# Patient Record
Sex: Female | Born: 1975 | Race: White | Hispanic: Yes | Smoking: Never smoker
Health system: Southern US, Community
[De-identification: ages and names within clinical notes are randomized; demographics above are authoritative.]

## PROBLEM LIST (undated history)

## (undated) DIAGNOSIS — K589 Irritable bowel syndrome without diarrhea: Secondary | ICD-10-CM

## (undated) DIAGNOSIS — O24419 Gestational diabetes mellitus in pregnancy, unspecified control: Secondary | ICD-10-CM

## (undated) DIAGNOSIS — R7303 Prediabetes: Secondary | ICD-10-CM

## (undated) DIAGNOSIS — E7800 Pure hypercholesterolemia, unspecified: Secondary | ICD-10-CM

## (undated) HISTORY — DX: Gestational diabetes mellitus in pregnancy, unspecified control: O24.419

## (undated) HISTORY — DX: Irritable bowel syndrome, unspecified: K58.9

---
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
## 2004-05-11 ENCOUNTER — Inpatient Hospital Stay (HOSPITAL_COMMUNITY): Admission: AD | Admit: 2004-05-11 | Discharge: 2004-05-15 | Payer: Self-pay | Admitting: Obstetrics

## 2004-05-13 ENCOUNTER — Encounter (INDEPENDENT_AMBULATORY_CARE_PROVIDER_SITE_OTHER): Payer: Self-pay | Admitting: *Deleted

## 2007-09-10 ENCOUNTER — Ambulatory Visit: Payer: Self-pay | Admitting: Internal Medicine

## 2007-09-12 ENCOUNTER — Ambulatory Visit: Payer: Self-pay | Admitting: *Deleted

## 2007-11-12 ENCOUNTER — Encounter (INDEPENDENT_AMBULATORY_CARE_PROVIDER_SITE_OTHER): Payer: Self-pay | Admitting: Family Medicine

## 2007-11-12 ENCOUNTER — Encounter (INDEPENDENT_AMBULATORY_CARE_PROVIDER_SITE_OTHER): Payer: Self-pay | Admitting: Nurse Practitioner

## 2007-11-12 ENCOUNTER — Ambulatory Visit: Payer: Self-pay | Admitting: Internal Medicine

## 2007-11-12 LAB — CONVERTED CEMR LAB
Basophils Absolute: 0.1 10*3/uL (ref 0.0–0.1)
Eosinophils Relative: 1 % (ref 0–5)
HCT: 39.4 % (ref 36.0–46.0)
Hemoglobin: 13.1 g/dL (ref 12.0–15.0)
Lymphocytes Relative: 34 % (ref 12–46)
Lymphs Abs: 1.9 10*3/uL (ref 0.7–4.0)
Monocytes Absolute: 0.5 10*3/uL (ref 0.1–1.0)
Neutro Abs: 3.1 10*3/uL (ref 1.7–7.7)
RBC: 4.51 M/uL (ref 3.87–5.11)
RDW: 12.7 % (ref 11.5–15.5)
WBC: 5.5 10*3/uL (ref 4.0–10.5)

## 2007-11-27 ENCOUNTER — Ambulatory Visit: Payer: Self-pay | Admitting: Family Medicine

## 2008-06-18 ENCOUNTER — Ambulatory Visit: Payer: Self-pay | Admitting: Family Medicine

## 2008-06-18 LAB — CONVERTED CEMR LAB
HDL: 47 mg/dL (ref >39)
LDL Cholesterol: 149 mg/dL — ABNORMAL HIGH (ref 0–99)
Total CHOL/HDL Ratio: 4.8
Triglycerides: 156 mg/dL — ABNORMAL HIGH (ref <150)

## 2009-08-08 ENCOUNTER — Encounter: Payer: Self-pay | Admitting: Obstetrics & Gynecology

## 2009-08-08 ENCOUNTER — Inpatient Hospital Stay (HOSPITAL_COMMUNITY): Admission: AD | Admit: 2009-08-08 | Discharge: 2009-08-10 | Payer: Self-pay | Admitting: Obstetrics

## 2009-11-21 DIAGNOSIS — R7303 Prediabetes: Secondary | ICD-10-CM

## 2009-11-21 HISTORY — DX: Prediabetes: R73.03

## 2010-01-15 ENCOUNTER — Ambulatory Visit: Payer: Self-pay | Admitting: Internal Medicine

## 2010-01-27 ENCOUNTER — Encounter (INDEPENDENT_AMBULATORY_CARE_PROVIDER_SITE_OTHER): Payer: Self-pay | Admitting: Adult Health

## 2010-01-27 ENCOUNTER — Ambulatory Visit: Payer: Self-pay | Admitting: Internal Medicine

## 2010-01-27 LAB — CONVERTED CEMR LAB
BUN: 12 mg/dL (ref 6–23)
Basophils Relative: 1 % (ref 0–1)
CO2: 23 meq/L (ref 19–32)
Calcium: 9.8 mg/dL (ref 8.4–10.5)
Chloride: 102 meq/L (ref 96–112)
Creatinine, Ser: 0.54 mg/dL (ref 0.40–1.20)
Eosinophils Absolute: 0.1 10*3/uL (ref 0.0–0.7)
Eosinophils Relative: 2 % (ref 0–5)
Glucose, Bld: 97 mg/dL (ref 70–99)
HCT: 41.2 % (ref 36.0–46.0)
Hgb A1c MFr Bld: 5.8 % (ref 4.6–6.1)
Lymphs Abs: 2.2 10*3/uL (ref 0.7–4.0)
MCHC: 33 g/dL (ref 30.0–36.0)
MCV: 87.5 fL (ref 78.0–100.0)
Monocytes Relative: 6 % (ref 3–12)
Pap Smear: NEGATIVE
RBC: 4.71 M/uL (ref 3.87–5.11)
TSH: 1.025 microintl units/mL (ref 0.350–4.500)
WBC: 5.9 10*3/uL (ref 4.0–10.5)

## 2010-07-02 ENCOUNTER — Ambulatory Visit: Payer: Self-pay | Admitting: Internal Medicine

## 2011-02-25 LAB — CBC
HCT: 33.8 % — ABNORMAL LOW (ref 36.0–46.0)
MCHC: 33.5 g/dL (ref 30.0–36.0)
Platelets: 274 10*3/uL (ref 150–400)
RBC: 3.72 MIL/uL — ABNORMAL LOW (ref 3.87–5.11)
RDW: 13.6 % (ref 11.5–15.5)
WBC: 12.9 10*3/uL — ABNORMAL HIGH (ref 4.0–10.5)

## 2011-02-25 LAB — RPR: RPR Ser Ql: NONREACTIVE

## 2011-04-06 ENCOUNTER — Other Ambulatory Visit (HOSPITAL_COMMUNITY)
Admission: RE | Admit: 2011-04-06 | Discharge: 2011-04-06 | Disposition: A | Discharge status: Home / Self Care | Payer: Self-pay | Source: Ambulatory Visit | Attending: Family Medicine | Admitting: Family Medicine

## 2011-04-06 ENCOUNTER — Other Ambulatory Visit: Payer: Self-pay | Admitting: Family Medicine

## 2011-04-06 DIAGNOSIS — Z01419 Encounter for gynecological examination (general) (routine) without abnormal findings: Secondary | ICD-10-CM | POA: Insufficient documentation

## 2012-05-02 ENCOUNTER — Other Ambulatory Visit: Payer: Self-pay | Admitting: Family Medicine

## 2013-01-30 ENCOUNTER — Other Ambulatory Visit (HOSPITAL_COMMUNITY): Payer: Self-pay | Admitting: Family Medicine

## 2013-01-30 DIAGNOSIS — Z1231 Encounter for screening mammogram for malignant neoplasm of breast: Secondary | ICD-10-CM

## 2013-02-08 ENCOUNTER — Ambulatory Visit (HOSPITAL_COMMUNITY)
Admission: RE | Admit: 2013-02-08 | Discharge: 2013-02-08 | Disposition: A | Discharge status: Home / Self Care | Payer: Self-pay | Source: Ambulatory Visit | Attending: Family Medicine | Admitting: Family Medicine

## 2013-02-08 DIAGNOSIS — Z1231 Encounter for screening mammogram for malignant neoplasm of breast: Secondary | ICD-10-CM | POA: Insufficient documentation

## 2015-02-26 ENCOUNTER — Emergency Department (INDEPENDENT_AMBULATORY_CARE_PROVIDER_SITE_OTHER)
Admission: EM | Admit: 2015-02-26 | Discharge: 2015-02-26 | Disposition: A | Discharge status: Home / Self Care | Payer: No Typology Code available for payment source | Source: Home / Self Care | Attending: Family Medicine | Admitting: Family Medicine

## 2015-02-26 ENCOUNTER — Encounter (HOSPITAL_COMMUNITY): Payer: Self-pay | Admitting: Emergency Medicine

## 2015-02-26 DIAGNOSIS — R51 Headache: Secondary | ICD-10-CM

## 2015-02-26 DIAGNOSIS — R519 Headache, unspecified: Secondary | ICD-10-CM

## 2015-02-26 LAB — POCT PREGNANCY, URINE: PREG TEST UR: NEGATIVE

## 2015-02-26 MED ORDER — KETOROLAC TROMETHAMINE 60 MG/2ML IM SOLN
60.0000 mg | Freq: Once | INTRAMUSCULAR | Status: AC
  Administered 2015-02-26: 60 mg via INTRAMUSCULAR

## 2015-02-26 MED ORDER — DICLOFENAC SODIUM 50 MG PO TBEC: 50.0000 mg | DELAYED_RELEASE_TABLET | Freq: Two times a day (BID) | ORAL | Status: DC | PRN

## 2015-02-26 MED ORDER — DEXAMETHASONE SODIUM PHOSPHATE 10 MG/ML IJ SOLN
INTRAMUSCULAR | Status: AC
  Filled 2015-02-26: qty 1

## 2015-02-26 MED ORDER — KETOROLAC TROMETHAMINE 60 MG/2ML IM SOLN
INTRAMUSCULAR | Status: AC
  Filled 2015-02-26: qty 2

## 2015-02-26 MED ORDER — METOCLOPRAMIDE HCL 5 MG/ML IJ SOLN
INTRAMUSCULAR | Status: AC
Start: 2015-02-26 — End: 2015-02-26
  Filled 2015-02-26: qty 2

## 2015-02-26 MED ORDER — DEXAMETHASONE SODIUM PHOSPHATE 10 MG/ML IJ SOLN
10.0000 mg | Freq: Once | INTRAMUSCULAR | Status: AC
  Administered 2015-02-26: 10 mg via INTRAMUSCULAR

## 2015-02-26 MED ORDER — METOCLOPRAMIDE HCL 5 MG/ML IJ SOLN
5.0000 mg | Freq: Once | INTRAMUSCULAR | Status: AC
  Administered 2015-02-26: 5 mg via INTRAMUSCULAR

## 2015-02-26 NOTE — ED Provider Notes (Signed)
[place] Don[place] PieriniJuana April Atkinson is a 39 y.o. female who presents to Urgent Care today for headache and dizziness. Symptoms present for the last 2 days. Patient notes vague dizzy sensation. She denies any syncope or room spinning. She states her symptoms are similar to previous episodes of headaches. She also notes pain in the right lateral neck. She has not tried any medications. No fevers or chills nausea vomiting or diarrhea. No loss of vision or function. No injury.   No past medical history on file. No past surgical history on file. History  Substance Use Topics  . Smoking status: Not on file  . Smokeless tobacco: Not on file  . Alcohol Use: Not on file   ROS as above Medications: No current facility-administered medications for this encounter.   No current outpatient prescriptions on file.   Not on File   Exam:  BP 126/74 mmHg  Pulse 85  Temp(Src) 98.2 F (36.8 C) (Oral)  Resp 18  SpO2 100%  Orthostatic VS for the past 24 hrs:  BP- Lying Pulse- Lying BP- Sitting Pulse- Sitting BP- Standing at 0 minutes Pulse- Standing at 0 minutes  02/26/15 1153 109/55 mmHg 75 107/63 mmHg 70 106/65 mmHg 76      Gen: Well NAD HEENT: EOMI,  MMM PERRLA Lungs: Normal work of breathing. CTABL Heart: RRR no MRG Abd: NABS, Soft. Nondistended, Nontender Exts: Brisk capillary refill, warm and well perfused.  Neck: Nontender to spinal midline. Tender palpation right trapezius. Normal neck range of motion. Reflexes and strength are intact and equal bilateral upper extremities. Neuro: Alert and oriented normal coordination balance gait sensation and strength.  Patient was given 60 mg IM Toradol, 10 mg IM dexamethasone, and 5 mg IM Reglan prior to discharge.  Results for orders placed or performed during the hospital encounter of 02/26/15 (from the past 24 hour(s))  Pregnancy, urine POC     Status: None   Collection Time: 02/26/15 11:49 AM  Result Value Ref Range   Preg Test, Ur NEGATIVE NEGATIVE    No results found.  Assessment and Plan: 39 y.o. female with headache. Headache type is likely migraine. Patient also has cervical myofascial strain. Prescribed Voltaren. Return as needed.  Discussed warning signs or symptoms. Please see discharge instructions. Patient expresses understanding.     Rodolph BongEvan S Americo Vallery, MD 02/26/15 [phone]

## 2015-02-26 NOTE — ED Notes (Signed)
Pt triaged and assessed by provider.   Provider in before nurse. 

## 2015-02-26 NOTE — Discharge Instructions (Signed)
Thank you for coming in today. Olivia Atkinson.Cefalea migraosa (Migraine Headache) Una cefalea migraosa es un dolor intenso y punzante en uno o ambos lados de la cabeza. La migraa puede durar desde 30 minutos hasta varias horas. CAUSAS  No siempre se conoce la causa exacta de la cefalea migraosa. Sin embargo, IT consultantpueden aparecer Circuit Citycuando los nervios del cerebro se irritan y liberan ciertas sustancias qumicas que causan inflamacin. Esto ocasiona dolor. Existen tambin ciertos factores que pueden desencadenar las migraas, como los siguientes:  Alcohol.  Fumar.  Estrs.  La menstruacin  Quesos aejados.  Los alimentos o las bebidas que contienen nitratos, glutamato, aspartamo o tiramina.  Falta de sueo.  Chocolate.  Cafena.  Hambre.  Actividad fsica extenuante.  Fatiga.  Medicamentos que se usan para tratar Aeronautical engineerel dolor en el pecho (nitroglicerina), pldoras anticonceptivas, estrgeno y algunos medicamentos para la hipertensin arterial. SIGNOS Y SNTOMAS  Dolor en uno o ambos lados de la cabeza.  Dolor pulsante o punzante.  Dolor intenso que impide Ameren Corporationrealizar las actividades diarias.  Dolor que se agrava por cualquier actividad fsica.  Nuseas, vmitos o ambos.  Mareos.  Dolor con la exposicin a las luces brillantes, a los ruidos fuertes o la Morrowactividad.  Sensibilidad general a las luces brillantes, a los ruidos fuertes o a los Limited Brandsolores. Antes de sufrir una migraa, puede recibir seales de advertencia (aura). Un aura puede incluir:  Ver las luces intermitentes.  Ver puntos brillantes, halos o lneas en zigzag.  Tener una visin en tnel o visin borrosa.  Sensacin de entumecimiento u hormigueo.  Dificultad para hablar  Debilidad muscular. DIAGNSTICO  La cefalea migraosa se diagnostica en funcin de lo siguiente:  Sntomas.  Examen fsico.  Neomia DearUna TC (tomografa computada) o resonancia magntica de la cabeza. Estas pruebas de diagnstico por imagen no pueden  diagnosticar las migraas, pero pueden ayudar a Sales promotion account executivedescartar otras causas de las cefaleas. TRATAMIENTO Le prescribirn medicamentos para Engineer, materialsaliviar el dolor y las nuseas. Tambin podrn administrarse medicamentos para ayudar a Armed forces training and education officerprevenir las migraas recurrentes.  INSTRUCCIONES PARA EL CUIDADO EN EL HOGAR  Slo tome medicamentos de venta libre o recetados para Primary school teachercalmar el dolor o Environmental health practitionerel malestar, segn las indicaciones de su mdico. No se recomienda usar los opiceos a Air cabin crewlargo plazo.  Cuando tenga la migraa, acustese en un cuarto oscuro y tranquilo  Lleve un registro diario para Financial risk analystaveriguar lo que puede provocar las Soil scientistcefaleas migraosas. Por ejemplo, escriba:  Lo que usted come y bebe.  Cunto tiempo duerme.  Algn cambio en su dieta o en los medicamentos.  Limite el consumo de bebidas alcohlicas.  Si fuma, deje de hacerlo.  Duerma entre 7 y 9horas, o segn las recomendaciones del mdico.  Limite el estrs.  Mantenga las luces tenues si le Goodrich Corporationmolestan las luces brillantes y la Marlettemigraa empeora. SOLICITE ATENCIN MDICA DE INMEDIATO SI:   La migraa se hace cada vez ms intensa.  Tiene fiebre.  Presenta rigidez en el cuello.  Tiene prdida de visin.  Presenta debilidad muscular o prdida del control muscular.  Comienza a perder el equilibrio o tiene problemas para caminar.  Sufre mareos o se desmaya.  Tiene sntomas graves que son diferentes a los primeros sntomas. ASEGRESE DE QUE:   Comprende estas instrucciones.  Controlar su afeccin.  Recibir ayuda de inmediato si no mejora o si empeora. Document Released: 11/07/2005 Document Revised: 08/28/2013 [facility] Patient Information 2015 [place], [place]. This information is not intended to replace advice given to you by your health care provider. Make sure you discuss  any questions you have with your health care provider. ° °

## 2015-03-02 ENCOUNTER — Encounter (HOSPITAL_COMMUNITY): Payer: Self-pay | Admitting: *Deleted

## 2015-03-02 ENCOUNTER — Emergency Department (HOSPITAL_COMMUNITY)
Admission: EM | Admit: 2015-03-02 | Discharge: 2015-03-02 | Disposition: A | Discharge status: Home / Self Care | Payer: No Typology Code available for payment source | Source: Home / Self Care | Attending: Emergency Medicine | Admitting: Emergency Medicine

## 2015-03-02 DIAGNOSIS — R42 Dizziness and giddiness: Secondary | ICD-10-CM

## 2015-03-02 DIAGNOSIS — G44209 Tension-type headache, unspecified, not intractable: Secondary | ICD-10-CM

## 2015-03-02 HISTORY — DX: Pure hypercholesterolemia, unspecified: E78.00

## 2015-03-02 MED ORDER — KETOROLAC TROMETHAMINE 60 MG/2ML IM SOLN
60.0000 mg | Freq: Once | INTRAMUSCULAR | Status: AC
  Administered 2015-03-02: 60 mg via INTRAMUSCULAR

## 2015-03-02 MED ORDER — KETOROLAC TROMETHAMINE 60 MG/2ML IM SOLN
INTRAMUSCULAR | Status: AC
  Filled 2015-03-02: qty 2

## 2015-03-02 MED ORDER — DEXAMETHASONE SODIUM PHOSPHATE 10 MG/ML IJ SOLN
10.0000 mg | Freq: Once | INTRAMUSCULAR | Status: AC
  Administered 2015-03-02: 10 mg via INTRAMUSCULAR

## 2015-03-02 MED ORDER — CYCLOBENZAPRINE HCL 10 MG PO TABS: 10.0000 mg | ORAL_TABLET | Freq: Every day | ORAL | Status: DC

## 2015-03-02 MED ORDER — DEXAMETHASONE SODIUM PHOSPHATE 10 MG/ML IJ SOLN
INTRAMUSCULAR | Status: AC
  Filled 2015-03-02: qty 1

## 2015-03-02 MED ORDER — MECLIZINE HCL 25 MG PO TABS: 25.0000 mg | ORAL_TABLET | Freq: Three times a day (TID) | ORAL | Status: DC

## 2015-03-02 NOTE — Discharge Instructions (Signed)
You have muscle tension in your neck and shoulders that is causing headaches. You also have some vertigo. Take meclizine 3 times a day for the next 5 days, then as needed for dizziness. Take Flexeril at bedtime for the next 5 days, then as needed for muscle aches. Follow-up if no improvement in 1 week.  Usted tiene la tensin muscular en el cuello y los hombros que est causando dolores de Turkmenistancabeza. Tambin tiene algunos vrtigo. Tomar meclizina 3 veces al Allstateda durante los 5 [address] siguientes, a continuacin, segn sea necesario para el Radissonmareo. Tome Flexeril por la noche durante los prximos 5 [address]  [address], a continuacin, segn sea necesario para los dolores musculares. Seguimiento si no mejora en 1 semana.

## 2015-03-02 NOTE — ED Notes (Signed)
Pt has had an occipital headache, neck pain and bilateral shoulder pain the past 6 days.  She was here 4 days ago and was better for about 3 days, but the pain has returned today.  She is taking diclofenac daily.  She denies nausea, vomiting or light sensitivity

## 2015-03-02 NOTE — ED Provider Notes (Addendum)
CSN: [id number]     Arrival date & time 03/02/15  [id number] History   First MD Initiated Contact with Patient 03/02/15 [phone]     Chief Complaint  Patient presents with  . Headache   (Consider location/radiation/quality/duration/timing/severity/associated sxs/prior Treatment) HPI  She is a 39 year old woman here for evaluation of headache and dizziness. She was seen here 4 days ago for similar symptoms. She was treated with some medications here and felt better. She is discharged with diclofenac, but she has been unable to take this. She took it once but experienced chest pain and shortness of breath that she did not take anymore of it. Her symptoms came back today. She reports a posterior headache associated with bilateral neck and shoulder pain. She denies any associated photophobia or nausea. She also states that when she gets up or lies down she will feel like the room is spinning around her chest to hold onto something. She also describes some generalized fatigue. No focal numbness, tingling, weakness. Denies anything makes the headache better or worse.  Past Medical History  Diagnosis Date  . Hypercholesteremia    History reviewed. No pertinent past surgical history. No family history on file. History  Substance Use Topics  . Smoking status: Never Smoker   . Smokeless tobacco: Not on file  . Alcohol Use: No   OB History    No data available     Review of Systems  Constitutional: Positive for fatigue. Negative for fever.  Eyes: Negative for photophobia.  Respiratory: Negative.   Gastrointestinal: Negative for nausea.  Musculoskeletal: Positive for neck pain. Negative for neck stiffness.  Neurological: Positive for dizziness and headaches. Negative for weakness and numbness.    Allergies  Review of patient's allergies indicates no known allergies.  Home Medications   Prior to Admission medications   Medication Sig Start Date End Date Taking? Authorizing Provider   cyclobenzaprine (FLEXERIL) 10 MG tablet Take 1 tablet (10 mg total) by mouth at bedtime. 03/02/15   Charm RingsErin J Kionte Baumgardner, MD  meclizine (ANTIVERT) 25 MG tablet Take 1 tablet (25 mg total) by mouth 3 (three) times daily. 03/02/15   Charm RingsErin J Amazing Cowman, MD   BP 118/65 mmHg  Pulse 80  Temp(Src) 97.9 F (36.6 C) (Oral)  Resp 18  SpO2 100%  LMP 02/21/2015 Physical Exam  Constitutional: She is oriented to person, place, and time. She appears well-developed and well-nourished. No distress.  HENT:  Mouth/Throat: Oropharynx is clear and moist.  Eyes: Conjunctivae and EOM are normal. Pupils are equal, round, and reactive to light.  Fundoscopic exam:      The right eye shows no papilledema.       The left eye shows no papilledema.  Neck: Neck supple.  Cardiovascular: Regular rhythm and normal heart sounds.   No murmur heard. Pulmonary/Chest: Effort normal and breath sounds normal. No respiratory distress. She has no wheezes. She has no rales.  Lymphadenopathy:    She has no cervical adenopathy.  Neurological: She is alert and oriented to person, place, and time. No cranial nerve deficit. She exhibits normal muscle tone. She displays a negative Romberg sign. Coordination and gait normal.  Rapid alternating movements and finger nose finger testing are normal.    ED Course  Procedures (including critical care time) Labs Review Labs Reviewed - No data to display  Imaging Review No results found.   MDM   1. Tension-type headache, not intractable, unspecified chronicity pattern   2. Vertigo    Toradol 60 mg  IM and dexamethasone 10 mg IM given.  No signs of intracranial process. Will treat with meclizine and Flexeril. Stop the diclofenac given what sounds like a gastritis type reaction. Follow-up if no improvement in 1 week.   Charm Rings, MD 03/02/15 9604  Charm Rings, MD 03/02/15 [phone]

## 2016-06-27 ENCOUNTER — Encounter (HOSPITAL_COMMUNITY): Payer: Self-pay | Admitting: Emergency Medicine

## 2016-06-27 ENCOUNTER — Ambulatory Visit (HOSPITAL_COMMUNITY)
Admission: EM | Admit: 2016-06-27 | Discharge: 2016-06-27 | Disposition: A | Discharge status: Home / Self Care | Payer: Self-pay | Attending: Family Medicine | Admitting: Family Medicine

## 2016-06-27 DIAGNOSIS — R52 Pain, unspecified: Secondary | ICD-10-CM | POA: Insufficient documentation

## 2016-06-27 DIAGNOSIS — J029 Acute pharyngitis, unspecified: Secondary | ICD-10-CM | POA: Insufficient documentation

## 2016-06-27 LAB — POCT RAPID STREP A: Streptococcus, Group A Screen (Direct): NEGATIVE

## 2016-06-27 NOTE — ED Triage Notes (Signed)
Sore throat, body aches/joint aches  That started Thursday 8/3.  Denies fever, denies anyone else at home sick

## 2016-06-27 NOTE — ED Provider Notes (Addendum)
[facility]    CSN: [id number] Arrival date & time: 06/27/16  1004  First Provider Contact:  First MD Initiated Contact with Patient 06/27/16 1058    History   Chief Complaint Chief Complaint  Patient presents with  . Sore Throat  . Generalized Body Aches    HPI Olivia Atkinson is a 40 y.o. female.   HPI She reports sore throat that is gradually worsening, 8/10, for the past 4 days. Pain does not radiate, is aching/burning, worse with po, but able to take po. Takes ibuprofen with minimal relief. No cough. No measured fevers, but reports chills, malaise. + some myalgias without arthralgias or rash. Also mild nausea with breakfast this morning without emesis. No diarrhea, abd pain, dysuria. No sick contacts, LMP: current.   Nonsmoker, no medications. [employer] Spanish video interpretor used throughout encounter.   Past Medical History:  Diagnosis Date  . Hypercholesteremia     There are no active problems to display for this patient.   History reviewed. No pertinent surgical history.  OB History    No data available     Home Medications    Prior to Admission medications   Medication Sig Start Date End Date Taking? Authorizing Provider  ibuprofen (ADVIL,MOTRIN) 200 MG tablet Take 200 mg by mouth every 6 (six) hours as needed.   Yes Historical Provider, MD  cyclobenzaprine (FLEXERIL) 10 MG tablet Take 1 tablet (10 mg total) by mouth at bedtime. 03/02/15   Charm Rings, MD  meclizine (ANTIVERT) 25 MG tablet Take 1 tablet (25 mg total) by mouth 3 (three) times daily. 03/02/15   Charm Rings, MD    Family History No family history on file.  Social History Social History  Substance Use Topics  . Smoking status: Never Smoker  . Smokeless tobacco: Never Used  . Alcohol use No     Allergies   Review of patient's allergies indicates no known allergies.   Review of Systems Review of Systems As above  Physical Exam Triage Vital Signs ED Triage  Vitals  Enc Vitals Group     BP 06/27/16 1056 100/65     Pulse Rate 06/27/16 1056 78     Resp 06/27/16 1056 12     Temp 06/27/16 1056 99.4 F (37.4 C)     Temp Source 06/27/16 1056 Oral     SpO2 06/27/16 1056 100 %     Weight --      Height --      Head Circumference --      Peak Flow --      Pain Score 06/27/16 1108 8     Pain Loc --      Pain Edu? --      Excl. in GC? --    No data found.   Updated Vital Signs BP 100/65 (BP Location: Left Arm)   Pulse 78   Temp 99.4 F (37.4 C) (Oral)   Resp 12   LMP 06/27/2016   SpO2 100%   Physical Exam  Constitutional: She is oriented to person, place, and time. She appears well-developed and well-nourished. No distress.  HENT:  Right Ear: External ear normal.  Left Ear: External ear normal.  Nose: Nose normal.  Mouth/Throat: No oropharyngeal exudate.  oropharynx erythematous with slightly enlarged tonsils without exudates.   Eyes: EOM are normal. Pupils are equal, round, and reactive to light. No scleral icterus.  Neck: Neck supple. No JVD present.  Cardiovascular: Normal rate, regular rhythm, normal  heart sounds and intact distal pulses.   No murmur heard. Pulmonary/Chest: Effort normal and breath sounds normal. No respiratory distress.  Abdominal: Soft. Bowel sounds are normal. She exhibits no distension. There is no tenderness.  Musculoskeletal: Normal range of motion. She exhibits no edema or tenderness.  Lymphadenopathy:    She has cervical adenopathy (tender anterior cervical LAN).  Neurological: She is alert and oriented to person, place, and time. She exhibits normal muscle tone.  Skin: Skin is warm and dry. Capillary refill takes less than 2 seconds.  Vitals reviewed.    UC Treatments / Results  Labs (all labs ordered are listed, but only abnormal results are displayed) Labs Reviewed - No data to display  EKG  EKG Interpretation None       Radiology No results found.  Procedures Procedures (including  critical care time)  Medications Ordered in UC Medications - No data to display  Initial Impression / Assessment and Plan / UC Course  I have reviewed the triage vital signs and the nursing notes.  Pertinent labs & imaging results that were available during my care of the patient were reviewed by me and considered in my medical decision making (see chart for details).  Final Clinical Impressions(s) / UC Diagnoses   Final diagnoses:  Acute pharyngitis, unspecified etiology   40 y.o. female with 4 days of sore throat and symptoms consistent with viral syndrome. Rapid strep negative. Pt is afebrile and in no distress. Advised that she treat symptomatically with lozenges, honey, hydration, relative rest, and return if symptoms fail to improve over the next week or she develops inability to swallow or trouble breathing.   New Prescriptions New Prescriptions   No medications on file     Tyrone Nineyan B Doretta Remmert, MD 06/27/16 1138    Tyrone Nineyan B Graysen Depaula, MD 06/27/16 [phone]

## 2016-06-29 LAB — CULTURE, GROUP A STREP (THRC)

## 2016-09-23 LAB — GLUCOSE, POCT (MANUAL RESULT ENTRY): POC GLUCOSE: 71 mg/dL (ref 70–99)

## 2017-06-20 ENCOUNTER — Encounter (HOSPITAL_COMMUNITY): Payer: Self-pay | Admitting: Emergency Medicine

## 2017-06-20 ENCOUNTER — Emergency Department (HOSPITAL_COMMUNITY)
Admission: EM | Admit: 2017-06-20 | Discharge: 2017-06-21 | Disposition: A | Discharge status: Home / Self Care | Payer: Self-pay | Attending: Emergency Medicine | Admitting: Emergency Medicine

## 2017-06-20 DIAGNOSIS — T7840XA Allergy, unspecified, initial encounter: Secondary | ICD-10-CM | POA: Insufficient documentation

## 2017-06-20 MED ORDER — SODIUM CHLORIDE 0.9 % IV BOLUS (SEPSIS)
1000.0000 mL | Freq: Once | INTRAVENOUS | Status: AC
  Administered 2017-06-20: 1000 mL via INTRAVENOUS

## 2017-06-20 MED ORDER — FAMOTIDINE IN NACL 20-0.9 MG/50ML-% IV SOLN
20.0000 mg | Freq: Once | INTRAVENOUS | Status: AC
  Administered 2017-06-20: 20 mg via INTRAVENOUS
  Filled 2017-06-20: qty 50

## 2017-06-20 MED ORDER — PREDNISONE 10 MG PO TABS: 40.0000 mg | ORAL_TABLET | Freq: Every day | ORAL | 0 refills | Status: AC

## 2017-06-20 MED ORDER — METHYLPREDNISOLONE SODIUM SUCC 125 MG IJ SOLR
125.0000 mg | Freq: Once | INTRAMUSCULAR | Status: AC
  Administered 2017-06-20: 125 mg via INTRAVENOUS
  Filled 2017-06-20: qty 2

## 2017-06-20 MED ORDER — EPINEPHRINE 0.3 MG/0.3ML IJ SOAJ: 0.3000 mg | Freq: Once | INTRAMUSCULAR | 0 refills | Status: AC

## 2017-06-20 MED ORDER — DIPHENHYDRAMINE HCL 50 MG/ML IJ SOLN
50.0000 mg | Freq: Once | INTRAMUSCULAR | Status: AC
  Administered 2017-06-20: 50 mg via INTRAVENOUS
  Filled 2017-06-20: qty 1

## 2017-06-20 NOTE — ED Provider Notes (Signed)
[facility]   CSN: [id number] Arrival date & time: 06/20/17  2225     History   Chief Complaint Chief Complaint  Patient presents with  . Allergic Reaction    HPI Olivia Atkinson is a 41 y.o. female.  HPI   Patient is a 41 year old female sent with allergic reaction. Patient reports that earlier today she Bit on the back by a bug at around 9:30 PM and since then she has been breaking out in hives, itchy. Patient denies any shortness of breath, trouble breathing.  Past Medical History:  Diagnosis Date  . Hypercholesteremia     There are no active problems to display for this patient.   History reviewed. No pertinent surgical history.  OB History    No data available       Home Medications    Prior to Admission medications   Medication Sig Start Date End Date Taking? Authorizing Provider  cyclobenzaprine (FLEXERIL) 10 MG tablet Take 1 tablet (10 mg total) by mouth at bedtime. 03/02/15   Charm RingsHonig, Erin J, MD  ibuprofen (ADVIL,MOTRIN) 200 MG tablet Take 200 mg by mouth every 6 (six) hours as needed.    [provider]  meclizine (ANTIVERT) 25 MG tablet Take 1 tablet (25 mg total) by mouth 3 (three) times daily. 03/02/15   Charm RingsHonig, Erin J, MD    Family History History reviewed. No pertinent family history.  Social History Social History  Substance Use Topics  . Smoking status: Never Smoker  . Smokeless tobacco: Never Used  . Alcohol use No     Allergies   Patient has no known allergies.   Review of Systems Review of Systems  Constitutional: Negative for activity change.  Respiratory: Negative for shortness of breath.   Cardiovascular: Negative for chest pain.  Gastrointestinal: Negative for abdominal pain.  Skin: Positive for rash.     Physical Exam Updated Vital Signs BP 128/60   Pulse 92   Temp 97.9 F (36.6 C) (Oral)   Resp 10   SpO2 99%   Physical Exam  Constitutional: She is oriented to person, place,  and time. She appears well-developed and well-nourished.  HENT:  Head: Normocephalic and atraumatic.  Eyes: Right eye exhibits no discharge.  Cardiovascular: Normal rate, regular rhythm and normal heart sounds.   No murmur heard. Pulmonary/Chest: Effort normal and breath sounds normal. She has no wheezes. She has no rales.  Abdominal: Soft. She exhibits no distension. There is no tenderness.  Neurological: She is oriented to person, place, and time.  Skin: Skin is warm and dry. Rash noted. She is not diaphoretic.  Hives on face and arms and chest   Psychiatric: She has a normal mood and affect.  Nursing note and vitals reviewed.    ED Treatments / Results  Labs (all labs ordered are listed, but only abnormal results are displayed) Labs Reviewed - No data to display  EKG  EKG Interpretation None       Radiology No results found.  Procedures Procedures (including critical care time)  Medications Ordered in ED Medications  famotidine (PEPCID) IVPB 20 mg premix (20 mg Intravenous New Bag/Given 06/20/17 2302)  diphenhydrAMINE (BENADRYL) injection 50 mg (50 mg Intravenous Given 06/20/17 2301)  methylPREDNISolone sodium succinate (SOLU-MEDROL) 125 mg/2 mL injection 125 mg (125 mg Intravenous Given 06/20/17 2302)     Initial Impression / Assessment and Plan / ED Course  I have reviewed the triage vital signs and the nursing notes.  Pertinent  labs & imaging results that were available during my care of the patient were reviewed by me and considered in my medical decision making (see chart for details).   well-appearing 41 year old female with uticaria.. Patient has no shortness of breath or respiratory symptoms. We'll treat with Benadryl and famotidine and steroids.  Will observe.  Will plan to give short course of pred, epi pen and follow up PCP.    Final Clinical Impressions(s) / ED Diagnoses   Final diagnoses:  None    New Prescriptions New Prescriptions   No  medications on file     Abelino DerrickMackuen, Laney Bagshaw Lyn, MD 06/21/17 1653

## 2017-06-20 NOTE — ED Triage Notes (Signed)
Pt presents to ED c/o uticaria, hives, redness.  Pt denies difficulty breathing at this time.  Patient denies any known allergies, states she drank a glass of cold milk approx 1 hour before this started.  Pt denies any new medicines, denies any new foods, creams, body wash, detergent.

## 2017-06-20 NOTE — ED Notes (Signed)
Unable to get pulse ox to pick up. Nurse first aware and will get repeat vitals in room.

## 2017-06-21 NOTE — ED Provider Notes (Signed)
Patient signed out pending recheck. In brief, 41 year old female with allergic reaction to likely a bite. Patient not given epinephrine but given steroids and Benadryl.  On my evaluation, she is nontoxic, vital signs are reassuring, no evidence of rash, or pharyngeal swelling, breath sounds are normal. Will discharge home per Dr.Mackuen's discharge instructions. Patient to be given prednisone and EpiPen.  After history, exam, and medical workup I feel the patient has been appropriately medically screened and is safe for discharge home. Pertinent diagnoses were discussed with the patient. Patient was given return precautions.    Shon BatonHorton, Courtney F, MD 06/21/17 [phone]

## 2017-06-21 NOTE — ED Notes (Signed)
Pt stable, ambulatory, states understanding of discharge instructions 

## 2018-08-13 ENCOUNTER — Other Ambulatory Visit: Payer: Self-pay | Admitting: Obstetrics and Gynecology

## 2018-08-13 DIAGNOSIS — F330 Major depressive disorder, recurrent, mild: Secondary | ICD-10-CM

## 2018-08-13 DIAGNOSIS — F411 Generalized anxiety disorder: Secondary | ICD-10-CM

## 2018-08-13 DIAGNOSIS — Z1231 Encounter for screening mammogram for malignant neoplasm of breast: Secondary | ICD-10-CM

## 2018-10-23 ENCOUNTER — Encounter (HOSPITAL_COMMUNITY): Payer: Self-pay

## 2018-10-23 ENCOUNTER — Ambulatory Visit
Admission: RE | Admit: 2018-10-23 | Discharge: 2018-10-23 | Disposition: A | Discharge status: Home / Self Care | Payer: Self-pay | Source: Ambulatory Visit | Attending: Obstetrics and Gynecology | Admitting: Obstetrics and Gynecology

## 2018-10-23 ENCOUNTER — Ambulatory Visit (HOSPITAL_COMMUNITY)
Admission: RE | Admit: 2018-10-23 | Discharge: 2018-10-23 | Disposition: A | Discharge status: Home / Self Care | Payer: Self-pay | Source: Ambulatory Visit | Attending: Obstetrics and Gynecology | Admitting: Obstetrics and Gynecology

## 2018-10-23 VITALS — BP 118/68 | Ht 63.0 in | Wt 148.0 lb

## 2018-10-23 DIAGNOSIS — Z1231 Encounter for screening mammogram for malignant neoplasm of breast: Secondary | ICD-10-CM

## 2018-10-23 DIAGNOSIS — Z1239 Encounter for other screening for malignant neoplasm of breast: Secondary | ICD-10-CM

## 2018-10-23 NOTE — Progress Notes (Signed)
No complaints today.   Pap Smear: Pap smear not completed today. Last Pap smear was in January 2019 at [facility] and normal per patient. Per patient has no history of an abnormal Pap smear. No Pap smear results are in Epic.  Physical exam: Breasts Breasts symmetrical. No skin abnormalities bilateral breasts. No nipple retraction bilateral breasts. No nipple discharge bilateral breasts. No lymphadenopathy. No lumps palpated bilateral breasts. No complaints of pain or tenderness on exam. [facility] Appointment scheduled for Tuesday, October 23, 2018 at 1230.        Pelvic/Bimanual No Pap smear completed today since last Pap smear was in January 2019 per patient. Pap smear not indicated per BCCCP guidelines.   Smoking History: Patient has never smoked.  Patient Navigation: Patient education provided. [facility] services [facility]. Spanish interpreter provided.   Breast and Cervical Cancer Risk Assessment: Patient has no family history of breast cancer, known genetic mutations, or radiation treatment to the chest before age 8930. Patient has no history of cervical dysplasia, immunocompromised, or DES exposure in-utero.  Risk Assessment    Risk Scores      10/23/2018   Last edited by: Priscille HeidelbergBrannock, Camille Thau P, RN   5-year risk: 0.5 %   Lifetime risk: 6.8 %         Used Spanish interpreter Natale LayErika McReynolds from [employer].

## 2018-10-23 NOTE — Patient Instructions (Signed)
Explained breast self awareness with [name]. Patient did not need a Pap smear today due to last Pap smear was in January 2019 per patient. Let her know BCCCP will cover Pap smears every 3 years unless has a history of abnormal Pap smears. [facility] Appointment scheduled for Tuesday, October 23, 2018 at 1230. Patient aware of appointment and will be there. Let patient know the Breast Center will follow up with her within the next couple weeks with results of mammogram by letter or phone. [place] PieriniJuana Atkinson verbalized understanding.  Ashaun Gaughan, Kathaleen Maserhristine Poll, RN 11:27 AM

## 2018-10-24 ENCOUNTER — Encounter (HOSPITAL_COMMUNITY): Payer: Self-pay | Admitting: *Deleted

## 2018-11-24 ENCOUNTER — Ambulatory Visit (INDEPENDENT_AMBULATORY_CARE_PROVIDER_SITE_OTHER): Payer: Self-pay

## 2018-11-24 ENCOUNTER — Ambulatory Visit (HOSPITAL_COMMUNITY)
Admission: EM | Admit: 2018-11-24 | Discharge: 2018-11-24 | Disposition: A | Discharge status: Home / Self Care | Payer: PRIVATE HEALTH INSURANCE | Attending: Family Medicine | Admitting: Family Medicine

## 2018-11-24 ENCOUNTER — Encounter (HOSPITAL_COMMUNITY): Payer: Self-pay | Admitting: Emergency Medicine

## 2018-11-24 DIAGNOSIS — M79645 Pain in left finger(s): Secondary | ICD-10-CM

## 2018-11-24 MED ORDER — MELOXICAM 7.5 MG PO TABS: 7.5000 mg | ORAL_TABLET | Freq: Every day | ORAL | 0 refills | Status: DC

## 2018-11-24 NOTE — ED Triage Notes (Signed)
Pt c/o L ring finger pain, unknown injuiry. L ring finger is swollen.

## 2018-11-24 NOTE — Discharge Instructions (Signed)
X-ray negative for fracture or dislocation. Start Mobic. Do not take ibuprofen (motrin/advil)/ naproxen (aleve) while on mobic.  Finger splint applied.  Ice compress, rest.  This may take a few weeks to completely resolve, but should be feeling better each week.  Follow-up with PCP for further evaluation if symptoms not improving.

## 2018-11-24 NOTE — ED Provider Notes (Signed)
[facility]    CSN: [id number] Arrival date & time: 11/24/18  1518     History   Chief Complaint Chief Complaint  Patient presents with  . Finger Pain    HPI Olivia Atkinson is a 43 y.o. female.   43 year old female comes in for left ring finger pain last night. No known injury/trauma.  States she was trying to catch her fall, and impacted her left ring finger.  She has pain and swelling.  Pain can radiate up the arm at times.  Has decreased range of motion.  Denies numbness, tingling.  Has not taken anything for the symptoms.     Past Medical History:  Diagnosis Date  . Hypercholesteremia     There are no active problems to display for this patient.   History reviewed. No pertinent surgical history.  OB History    Gravida  3   Para      Term      Preterm      AB      Living  3     SAB      TAB      Ectopic      Multiple      Live Births  3            Home Medications    Prior to Admission medications   Medication Sig Start Date End Date Taking? Authorizing Provider  cyclobenzaprine (FLEXERIL) 10 MG tablet Take 1 tablet (10 mg total) by mouth at bedtime. Patient not taking: Reported on 06/21/2017 03/02/15   Charm RingsHonig, Erin J, MD  meclizine (ANTIVERT) 25 MG tablet Take 1 tablet (25 mg total) by mouth 3 (three) times daily. Patient not taking: Reported on 06/21/2017 03/02/15   Charm RingsHonig, Erin J, MD  meloxicam (MOBIC) 7.5 MG tablet Take 1 tablet (7.5 mg total) by mouth daily. 11/24/18   Belinda FisherYu, Amy V, PA-C    Family History Family History  Problem Relation Age of Onset  . Diabetes Mother   . Hypertension Mother   . Hyperlipidemia Mother   . Diabetes Maternal Grandfather   . Cancer Paternal Grandmother   . Breast cancer Neg Hx     Social History Social History   Tobacco Use  . Smoking status: Never Smoker  . Smokeless tobacco: Never Used  Substance Use Topics  . Alcohol use: No  . Drug use: No     Allergies   Patient has no  known allergies.   Review of Systems Review of Systems  Reason unable to perform ROS: See HPI as above.     Physical Exam Triage Vital Signs ED Triage Vitals [11/24/18 1650]  Enc Vitals Group     BP (!) 131/55     Pulse Rate 91     Resp 18     Temp 98 F (36.7 C)     Temp src      SpO2 100 %     Weight      Height      Head Circumference      Peak Flow      Pain Score      Pain Loc      Pain Edu?      Excl. in GC?    No data found.  Updated Vital Signs BP (!) 131/55   Pulse 91   Temp 98 F (36.7 C)   Resp 18   LMP 11/12/2018   SpO2 100%   Physical Exam  Constitutional:      General: She is not in acute distress.    Appearance: She is well-developed. She is not diaphoretic.  HENT:     Head: Normocephalic and atraumatic.  Eyes:     Conjunctiva/sclera: Conjunctivae normal.     Pupils: Pupils are equal, round, and reactive to light.  Musculoskeletal:     Comments: Swelling to the left PIP joint of the fourth finger.  No erythema, warmth, contusion.  Tenderness to palpation of PIP joint.  Decreased flexion due to pain and swelling.  Sensation intact and equal bilaterally.  Radial pulse 2+, cap refill less than 2 seconds.  Neurological:     Mental Status: She is alert and oriented to person, place, and time.      UC Treatments / Results  Labs (all labs ordered are listed, but only abnormal results are displayed) Labs Reviewed - No data to display  EKG None  Radiology Dg Finger Ring Left  Result Date: 11/24/2018 CLINICAL DATA:  Per pt: don't know how she injured the left ring finger, injury yesterday, patient pointed to the left ring finger, proximal and distally. No prior injury. EXAM: LEFT RING FINGER 2+V COMPARISON:  None. FINDINGS: There is no evidence of fracture or dislocation. There is no evidence of arthropathy or other focal bone abnormality. Soft tissues are unremarkable. IMPRESSION: Negative. Electronically Signed   By: Amie Portland M.D.   On:  11/24/2018 18:36    Procedures Procedures (including critical care time)  Medications Ordered in UC Medications - No data to display  Initial Impression / Assessment and Plan / UC Course  I have reviewed the triage vital signs and the nursing notes.  Pertinent labs & imaging results that were available during my care of the patient were reviewed by me and considered in my medical decision making (see chart for details).    X-ray negative for fracture or dislocation.  NSAIDs, ice compress, finger splint applied.  Return precautions given.  Patient expresses understanding and agrees with plan.  Final Clinical Impressions(s) / UC Diagnoses   Final diagnoses:  Pain of left middle finger    ED Prescriptions    Medication Sig Dispense Auth. Provider   meloxicam (MOBIC) 7.5 MG tablet Take 1 tablet (7.5 mg total) by mouth daily. 15 tablet Threasa Alpha, [place] 11/24/18 1911

## 2019-03-08 IMAGING — DX DG FINGER RING 2+V*L*
3 series · 3 of 3 positions shown · non-contrast
Comparison: None.

CLINICAL DATA: Per pt: don't know how she injured the left ring
finger, injury yesterday, patient pointed to the left ring finger,
proximal and distally. No prior injury.

EXAM:
LEFT RING FINGER 2+V

[finger ap]
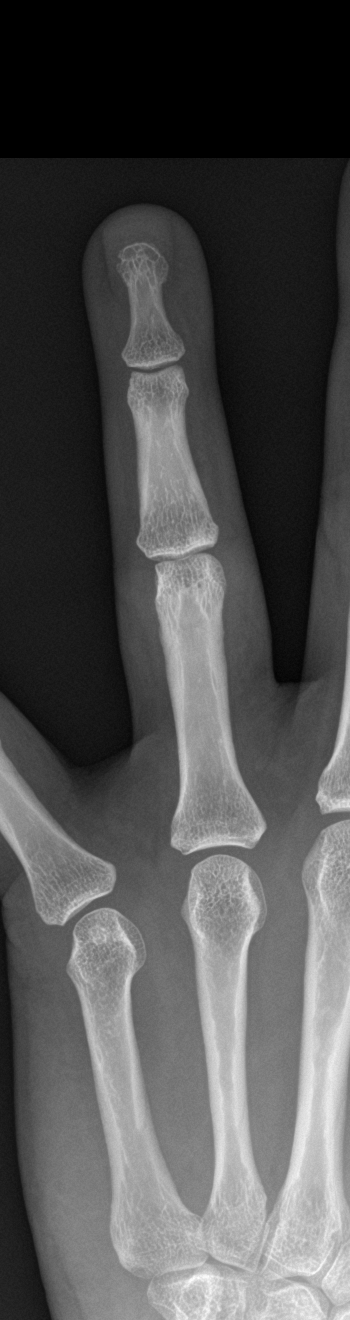

[finger obl]
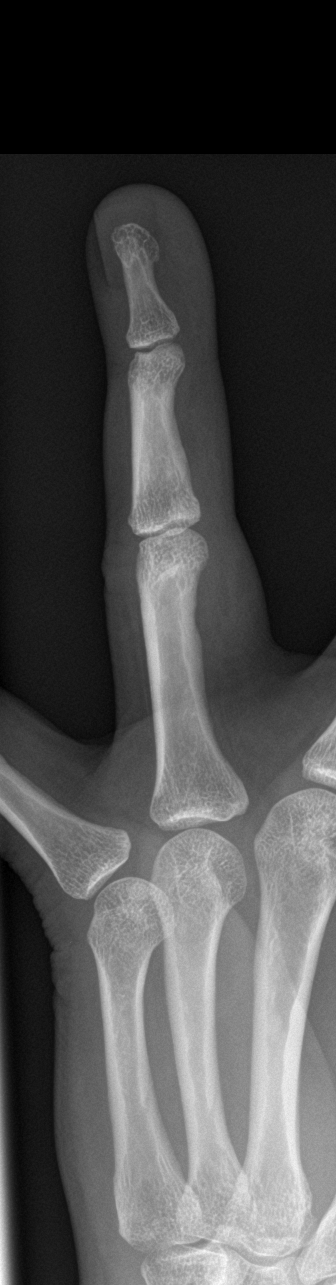

[finger lat]
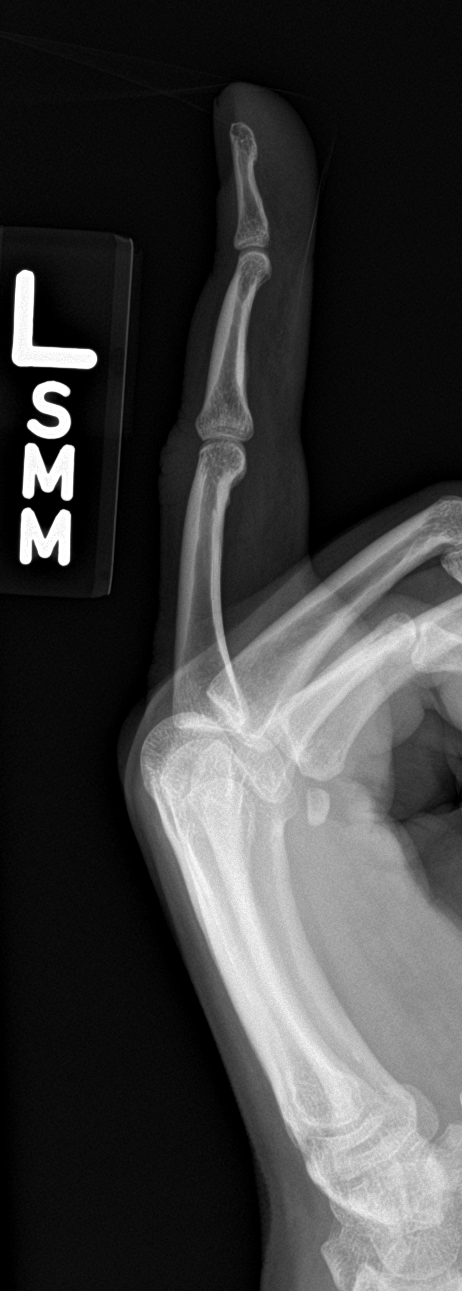

[3 of 3 positions shown; findings below may reference images not displayed]

FINDINGS: There is no evidence of fracture or dislocation. There is no
evidence of arthropathy or other focal bone abnormality. Soft
tissues are unremarkable.
IMPRESSION: Negative.

## 2019-05-29 ENCOUNTER — Other Ambulatory Visit: Payer: Self-pay

## 2019-05-29 ENCOUNTER — Telehealth: Payer: Self-pay | Admitting: Internal Medicine

## 2019-06-06 ENCOUNTER — Ambulatory Visit: Payer: Self-pay | Admitting: Internal Medicine

## 2019-06-06 ENCOUNTER — Other Ambulatory Visit: Payer: Self-pay

## 2019-06-06 ENCOUNTER — Encounter: Payer: Self-pay | Admitting: Internal Medicine

## 2019-06-06 VITALS — BP 116/78 | HR 78 | Resp 12 | Ht 61.0 in | Wt 147.0 lb

## 2019-06-06 DIAGNOSIS — R7303 Prediabetes: Secondary | ICD-10-CM

## 2019-06-06 DIAGNOSIS — F439 Reaction to severe stress, unspecified: Secondary | ICD-10-CM

## 2019-06-06 DIAGNOSIS — K582 Mixed irritable bowel syndrome: Secondary | ICD-10-CM

## 2019-06-06 DIAGNOSIS — E782 Mixed hyperlipidemia: Secondary | ICD-10-CM

## 2019-06-06 MED ORDER — DICYCLOMINE HCL 20 MG PO TABS: ORAL_TABLET | ORAL | 11 refills | Status: DC

## 2019-06-06 NOTE — Progress Notes (Signed)
Subjective:    Patient ID: Olivia Atkinson, female   DOB: Sep 25, 1976, 43 y.o.   MRN: [id number]   HPI   Here to establish Interpreted  1.  Abdominal bloating/distension:  For past year after eating, feels bloated and with lots of gas.   She gets the problem episodically--when she has this, it generally is after every meal for 3 weeks at a time and then goes away.   Her abdominal bloating is generalized.  She does feel better after passing flatus or a BM, which tends to be loose and explosive.   Can be constipated with this as well, but generally the former.   She is better until the next meal.  No weight loss with this. No melena or hematochezia that she is aware of--doesn't observe the results.  No associated nausea or vomiting.   Has a good appetite despite this.   Milk seems to cause more trouble for her.   Has not tried to remove dairy from her diet to see if it makes a difference  Has had this in past.  Was evaluated and treated for something similar in the past.   Apparently was at [facility] maybe 1 year or more ago.   Took unknown medication 1 1/2 tabs once daily.  Was told would help her go to the bathroom.   She cannot say what she was told was wrong.  Was told they were referring her to a specialist, but that never happened.  Admits this has been a problem on and off for many years--particularly after married and when she gets particularly stressed. 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  She is having lots of stress due to having 2 teenagers and a husband at home.  Too much going on.  Her husband is working and they do have an income. Patient also works 2 days weekly. Works on [employer]   2.  Hyperlipidemia:  Noted in this chart first in 2009.  Taking Atorvastatin for past 1-2 years.   She states she did work on diet and physical activity before med started.  She is not eating well now.  Got tired of doing so, because did not seem to make a  difference with her cholesterol level.    3.  Prediabetes with A1C of 5.8% in 2011.    Current Meds  Medication Sig  . atorvastatin (LIPITOR) 10 MG tablet Take 10 mg by mouth daily.   No Known Allergies   Review of Systems    Objective:   BP 116/78 (BP Location: Left Arm, Patient Position: Sitting, Cuff Size: Normal)   Pulse 78   Resp 12   Ht 5\' 1"  (1.549 m)   Wt 147 lb (66.7 kg)   LMP 05/30/2019   BMI 27.78 kg/m   Physical Exam  NAD HEENT:  PERRL, EOMI, TMs pearly gray, throat without injection. Neck:  Supple, No adenopathy, no thyromegaly Chest:  CTA CV:  RRR with normal S1 and S2, No S3, S4 or murmur.  Radial and DP pulses normal and equal.  LE without edema Abd:  S, NT, No HSM, no mass, + BS  Normal exam Assessment & Plan   1.  IBS:  Metamucil:  1/2 dose powder in 8 oz water daily and gradually increase as tolerated. Dicyclomine 20 mg every 6 hours before meals as needed. CBC, CMP tomorrow.  2.  Stress:  T. Maxey warm hand off for screening and counseling if patient willing  to work on stress reduction with her.  3.  Hyperlipidemia:  FLP tomorrow  4.  Prediabetes:  A1C tomorrow.  Discussed a healthy diet for both #3 and 4.  Regular daily physical activity as well.

## 2019-06-06 NOTE — Patient Instructions (Signed)
Metamucil o Citrucel 1/2 dose powder in 8 oz water once daily and gradually increase as tolerated to full dose daily.

## 2019-06-07 ENCOUNTER — Other Ambulatory Visit: Payer: Self-pay

## 2019-06-07 DIAGNOSIS — E782 Mixed hyperlipidemia: Secondary | ICD-10-CM

## 2019-06-07 DIAGNOSIS — Z79899 Other long term (current) drug therapy: Secondary | ICD-10-CM

## 2019-06-07 DIAGNOSIS — R7303 Prediabetes: Secondary | ICD-10-CM

## 2019-06-08 LAB — HGB A1C W/O EAG: Hgb A1c MFr Bld: 5.9 % — ABNORMAL HIGH (ref 4.8–5.6)

## 2019-06-08 LAB — COMPREHENSIVE METABOLIC PANEL
ALT: 13 IU/L (ref 0–32)
AST: 15 IU/L (ref 0–40)
Albumin/Globulin Ratio: 1.5 (ref 1.2–2.2)
Albumin: 4 g/dL (ref 3.8–4.8)
Alkaline Phosphatase: 44 IU/L (ref 39–117)
BUN/Creatinine Ratio: 20 (ref 9–23)
BUN: 13 mg/dL (ref 6–24)
Bilirubin Total: <0.2 mg/dL (ref 0.0–1.2)
CO2: 23 mmol/L (ref 20–29)
Calcium: 9.2 mg/dL (ref 8.7–10.2)
Chloride: 106 mmol/L (ref 96–106)
Creatinine, Ser: 0.64 mg/dL (ref 0.57–1.00)
GFR calc Af Amer: 126 mL/min/{1.73_m2} (ref >59)
GFR calc non Af Amer: 110 mL/min/{1.73_m2} (ref >59)
Globulin, Total: 2.7 g/dL (ref 1.5–4.5)
Glucose: 79 mg/dL (ref 65–99)
Potassium: 4.3 mmol/L (ref 3.5–5.2)
Sodium: 142 mmol/L (ref 134–144)
Total Protein: 6.7 g/dL (ref 6.0–8.5)

## 2019-06-08 LAB — CBC WITH DIFFERENTIAL/PLATELET
Basophils Absolute: 0.1 10*3/uL (ref 0.0–0.2)
Basos: 2 %
EOS (ABSOLUTE): 0.2 10*3/uL (ref 0.0–0.4)
Eos: 3 %
Hematocrit: 36.1 % (ref 34.0–46.6)
Hemoglobin: 11.8 g/dL (ref 11.1–15.9)
Immature Grans (Abs): 0 10*3/uL (ref 0.0–0.1)
Immature Granulocytes: 0 %
Lymphocytes Absolute: 1.9 10*3/uL (ref 0.7–3.1)
Lymphs: 36 %
MCH: 28.8 pg (ref 26.6–33.0)
MCHC: 32.7 g/dL (ref 31.5–35.7)
MCV: 88 fL (ref 79–97)
Monocytes Absolute: 0.4 10*3/uL (ref 0.1–0.9)
Monocytes: 7 %
Neutrophils Absolute: 2.8 10*3/uL (ref 1.4–7.0)
Neutrophils: 52 %
Platelets: 234 10*3/uL (ref 150–450)
RBC: 4.1 x10E6/uL (ref 3.77–5.28)
RDW: 12.4 % (ref 11.7–15.4)
WBC: 5.3 10*3/uL (ref 3.4–10.8)

## 2019-06-08 LAB — LIPID PANEL W/O CHOL/HDL RATIO
Cholesterol, Total: 133 mg/dL (ref 100–199)
HDL: 50 mg/dL (ref >39)
LDL Calculated: 69 mg/dL (ref 0–99)
Triglycerides: 71 mg/dL (ref 0–149)
VLDL Cholesterol Cal: 14 mg/dL (ref 5–40)

## 2019-06-11 ENCOUNTER — Telehealth: Payer: Self-pay | Admitting: Internal Medicine

## 2019-06-11 NOTE — Telephone Encounter (Signed)
Please call patient and find out if she is eating when she takes the medication and Dicyclomine can make you sleepy. Is she still having abdominal pain?

## 2019-06-11 NOTE — Telephone Encounter (Signed)
To Dr. Mulberry for further directions 

## 2019-06-11 NOTE — Telephone Encounter (Signed)
After called patient to informed of lab results, patient states after starts taking Dicyclomine 20 mg has been feeling with dizziness and fatigue since Sunday after takes first tab on Saturday. Patient denied any other symptom.  Patient states 1/2 hour after takes medicine starts feeling sick.  Please advise.

## 2019-06-11 NOTE — Telephone Encounter (Signed)
Spoke with patient who states is taking medication before meals and still having abdominal pain. Also informed patient Dicyclomine can make her sleepy.

## 2019-06-12 NOTE — Telephone Encounter (Signed)
Have her try taking a half tab--make sure she has tablets and not capsules Olivia Atkinson tagged just as an [place]

## 2019-06-13 NOTE — Telephone Encounter (Signed)
Spoke with pt. Who state has the tablets one; information was given and pt. Verbalized understanding. Patient instructed to call back with report.

## 2019-06-13 NOTE — Telephone Encounter (Signed)
noted 

## 2019-06-25 ENCOUNTER — Ambulatory Visit: Payer: Self-pay | Admitting: Licensed Clinical Social Worker

## 2019-06-25 ENCOUNTER — Other Ambulatory Visit: Payer: Self-pay

## 2019-06-25 DIAGNOSIS — F411 Generalized anxiety disorder: Secondary | ICD-10-CM

## 2019-06-28 NOTE — Psychosocial Assessment (Addendum)
Patient Description: Patient was dressed appropriately for age, relaxed and with good hygiene.    Subjective Complaint: Patient states that she is always anxious and does not sleep well due to anxiety and worry. Patient states she has always been an anxious person and she can remember her anxiety started when she was around 43 years old. Patient states she was very poor growing up and she always worried about finances and having enough food to eat and that kept her up at night. Patient states her anxiety increased after she was married. Patient states that a major source of her anxiety is her financial difficulties and lack of familial support.  Patient states that she does not work and has been unable to find full-time work sine her youngest child began kindergarten. Patient states her relationship with 17 y/o daughter causes her stress and they have many disagreements about her daughter's behaviors. Patient states that she does not feel supported by her husband or her husband's family and she has no close family members of her own, except for one sister. Patient states current status is also a major source of anxiety.  Objective Finding:  Patient is attentive and oriented to time and place.  Patient become irritated and mildly elevated when speaking about relationship with daughter and husband. Patient is experiencing lack of sleep due to anxiety.  Assessment of Progress: Patient has some trouble with insight, expressing her feelings and about her concerns. Patient feels isolated from husband and support system. Patient has increased anxiety due to lack of financial security and support. Patient cries frequently but quickly makes herself stop. Patient is experiencing symptoms of anxiety and depression, which are increasingly making it difficult to participate in activities of daily living.  Plans for Next Session: Patient will notice what her husband does that she appreciates. Patient will begin to keep  a journal to identify what is making her anxious at that moment.  Patient will also begin to practice good sleep hygiene by going to sleep at the same time every night and not waiting up for her 27 y/o daughter to come home.  INFORMATION  Client name: Olivia Atkinson Date of birth: 05-28-1976  Address: [address] [place], [place] [address]  Telephone: [phone] Email: [email or web address]  Emergency contact name and telephone: Lily Lovings [phone]  Transgender: N/A  Sexual orientation: heterosexual Preferred pronouns: she/her/hers Preferred name: [name]  Race: White Ethnicity: Hispanic  Country of origin: [place] Number of years in the [place]:  16 Current legal status:   Language Preferred: Spanish  Marital Status:   How long?    Live in same home? yes If not married, long term relationship? yes How long? 17 years   EDUCATIONAL HISTORY  High School Name:   Graduated? If, no last grade attended?  5th grade   College/University Name: N/A If still in college/university, current level? N/A  Year graduated (or expected graduation date) N/A Major/Program Masters in Education  Currently receiving academic assistance and accessibility services? N/A  If not currently in school, EC/IEP services in the past? What type? N/A  Changed schools frequently? N/A  Truancy/attendance issues?  N/A  History of suspensions, expulsions (if any) (reasons, dates):  N/A   Interests in school, sports, extracurricular activities:   N/A    Have you experienced bullying and/or discrimination in school?   (include previous/current examples and age when it occurred) N/A      Have you ever bullied and/or discriminated against anyone in school? 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   Have you been promoted?   If yes, from which position and when?   What is your standing at your current employer (good, probation, etc.) If you are on probation, please describe:  Does your employer have any American Disabilities Act accommodations for you? If yes, please describe accommodations:   Do you like your immediate supervisor/co-workers? If no, what are the reasons? Do you have any acquaintances/friends at work? If yes, do you socialize with your co-workers outside of work?  Current Military status (if applicable include dishonorable discharges and the reason):   N/A   PRESENTING CONCERNS AND SYMPTOMS (problems/symptoms, frequency of symptoms, triggers, family dynamics, etc.)  Patient states she is experiencing anxiety and insomnia.   HISTORY OF PRESENTING PROBLEMS (precipitating events, trauma history, when symptoms/behaviors began, life changes, etc.)  Anxiety since childhood.    PHYSICIAN and/or [place] (in the last 2 years) (most current provider first)   Name of Facility/Provider Address and contact information Type of service:   Dr. Amanda Cockayne [facility]             [place] TREATMENT HISTORY  Dates: from Dates: To Facility/Provider Tx Type   Outcome/Follow-up and Compliance                         SYMPTOMS (mark with X/how long, if present)  DEPRESSIVE SYMPTOMS  Sadness/crying/depressed mood:         Suicidal thoughts:   Sleep disturbance: X years   Irritability: X  years Feelings of worthlessness/guilt: X years   Anhedonia (inability to feel pleasure):   Psychomotor agitation/retardation (excessive physical activity/difficulty with physical coordination):      Reduced appetite/weight loss:   Fatigue:     Increased appetite/weight gain:   Concentration/ memory problems: X years    ANXIETY SYMPTOMS  Separation anxiety (from family/children/friends/animals): X years Obsessions/compulsions:      Phobia: mice/rats X years Agoraphobia symptoms (fear of leaving the safety of the home):     Social anxiety:   Excessive anxiety/worry: X years   Feeling of dread/doom:   Cannot control worry: X years   Panic attacks:   Restlessness/difficulty relaxing: X years   Irritability: X years Muscle tension/ sweating/nausea /trembling:     Excessive use of the bathroom due to stomach discomfort:    Easily startled: X years    ATTENTION SYMPTOMS    Avoids tasks that require mental effort:   Often loses/misplaces things:     Makes careless mistakes:   Easily distracted by extraneous (outside) stimuli:     Difficulty sustaining attention:   Forgetful in daily activities:     Does not seem to listen when spoken to:    Messy/disorganized:     Does not follow instructions/fails to finish:   Unable to  sit quietly; fidgets, squirms:     Talks excessively: X  Impatient when waiting:  X     Interrupts or intrudes on others: X  "On the go"/ "Driven by a motor":      MANIC SYMPTOMS  Elevated, expansive or irritable mood: X  Decreased need for sleep (without a desire to sleep/not feeling tired):     Abnormally increased goal-directed activity or energy:    Flight of ideas/racing thoughts:     Inflated self-esteem/grandiosity:   High risk activities, (sexual and non-sexual):      PSYCHOTIC SYMPTOMS  Delusions (not being able to tell what is real or what is imagined):                             Hallucinations (Visual/Auditory) (seeing/hearing things that are not seen or heard by  others):     Disorganized thinking/speech (difficult to understand/comprehend when speaking): X  Disorganized or abnormal motor behavior:     Negative symptoms (decrease or loss of ability to respond emotionally):  X  Catatonia (immobility and/or stupor):            TRAUMA CHECKLIST (TRIGGER WARNING)  Have you ever experienced the following? If yes, describe  Have you ever been in a natural disaster, terrorist attack, or war?    Age:                          Duration: N/A  Have you ever been the victim of a violent crime (e.g. kidnapping, robbery, assault) or a crime with a deadly weapon (e.g. gun, knife)?     Age:                       Year: N/A   Have you ever been the victim of a hate crime?  Age:                               Year: N/A   Have you ever been in a fire?  Age:                                      Year:  N/A  Have you ever been in a serious car accident? Age:               Were you physically injured?  YES / NO (if yes) Were you hospitalized?   YES / NO          Duration:  N/A  Have you ever been incarcerated?    YES / NO     Duration: Were you convicted of a violent crime? YES / NO What crime were you convicted of?  N/A  Have you ever been seriously hurt or injured? (for example: non-vehicle accident, fall, fight) YES / NO    Were you hospitalized? YES / NO                        Duration:  N/A  Have you ever experienced a life-threatening illness or injury?  YES / NO Are you still under medical treatment for that injury?    N/A  Have you ever been hospitalized for an extended period of time, for any reason?  N/A  As a child, adolescent or adult, has there ever been a time when you did not have enough food to eat?   Yes  As a child, adolescent or adult, have you ever been homeless or did move around a lot due to financial difficulties?  N/A   As a child, adolescent or adult, have you ever seen or heard someone (family or not) being physically and/or  verbally abused or get threatened with bodily harm?  Yes  As a child, adolescent or adult, have you ever seen, someone who was dead or dying, or watched or heard them being killed?  N/A  As a child, adolescent or adult, have you ever seen or heard someone being killed?  N/A  As a child, adolescent or adult, have you ever been physically or  verbally aggressive towards other people?  N/A  As a child, adolescent or adult, has anyone ever stalked you (physically and/or electronically) or tried to kidnap you?  N/A   As a child and/or adolescent, has anyone ever made you do (or tried to make you do) sexual things that you didn't want to do, like touch you, make you touch them, or try to have any kind of sex with you?  N/A  Has anyone (stranger or someone known to you) ever forced you to have sexual relations/intercourse?  N/A   As a child, adolescent or adult have you ever been the victim of sex trafficking and/or forced into sex work?   N/A  PTSD REACTIONS/SYMPTOMS (mark with X if present)  Recurrent and intrusive distressing memories of event:  Flashbacks/Feels/acts as if the event were recurring:   Distressing dreams related to the event:  Intense psychological distress to reminders of event:   Avoidance of memories, thoughts, feelings about event:  Physiological reactions to reminders of event:   Avoidance of external reminders of event:  Inability to remember aspects of the event:   Negative beliefs about oneself, others, the world:  Persistent negative emotional state/self-blame:   Detachment/inability to feel positive emotions:  Alterations in arousal activities (jumpiness, starling easily, being on edge)   SUBSTANCE ABUSE  Substance Age of 1st Use Amount/frequency Last Use              Motivation for use:     Do you spend a lot of time or effort in obtaining and using a substance?   Does your substance use affect your activities of daily living (eating, working, socializing,  bathing)?   Do you use more often or in bigger amount than planned?    Tolerance issues:    Interest in reducing use and attaining abstinence:    Longest period of abstinence:    Withdrawal symptoms:    Problems usage caused (physical, financial and/or social):    Non-chemical addiction issues: (gambling, pornography, etc)    Problems non-chemical addiction has caused (both physical, financial and/or social):     DEVELOPMENT (please list any diagnosis, issues or concerns)  Developmental milestones (crawling, walking, talking, etc):   Developmental condition (delay, autism, etc):    Cognitive decline (forgetfulness, Alzheimer's, dementia)   Learning disabilities:     PSYCHOSOCIAL STRENGTHS AND STRESSORS  Religious/cultural  preferences: Catholic  Identified support persons: (include at least two people)   Name  [place] Telephone/Email  Strengths/abilities/talents:   Manual [place]  Hobbies/leisure:   Singing, cooking  Relationship concerns/needs:   Yes, relationship issues with partner and oldest daughter.  Financial concerns/needs:   Would like  to work outside of [employer] (other sources of income, not from job):   Concerned about finances  Housing concerns/needs:       SIGNATURE  Print Patient Name:  Olivia Atkinson Date:  06/25/2019    Client name: Olivia Atkinson Date of birth: 01/07/1976    RISK ASSESSMENT (mark with X if present)  Current danger to self Thoughts of suicide/death:  Self-harming behaviors:    Suicide attempt:  Has plan:    Comments/clarify:       Past danger to self Thoughts of suicide/death:   Self-harming behaviors:    Suicide attempt:  Family history of suicide:    Comments/clarify:       Current danger to others Thoughts to harm others:  Plans to harm others:    Threats to harm others:  Attempt to harm others:    Comments/clarify:      Past danger to others  Thoughts to harm others:  Plans to harm others:    Threats to harm others:  Attempt to harm others:    Comments/clarify:     RISK TO SELF Low to no risk: X Moderate risk:  Severe risk:   RISK TO OTHERS Low to no risk: X Moderate risk:  Severe risk:      MENTAL STATUS (mark with X if observed)  APPEARANCE/DRESS  Neat:  Good hygiene: X Age appropriate: X   Sloppy:  Fair hygiene:  Eccentric:    Relaxed: X Poor hygiene:       BEHAVIOR Attentive: X Passive:   Adequate eye contact:  X   Guarded:  Defensive:   Minimal eye contact:    Cooperative: X Hostile/irritable:   No eye contact:     MOTOR Hyper:  Hypo:  Rapid:    Agitated: X Tics:  Tremors:    Lethargic:  Calm:       LANGUAGE Unremarkable:  Pressured: mild X Expressive intact:  X   Mute:  Slurred:  Receptive intact: X    AFFECT/MOOD  Calm:  Anxious: X Inappropriate:    Depressed:  Flat:  Elevated:     Labile:  Agitated:  Hypervigilant:     THOUGHT FORM Unremarkable:   Illogical:  Indecisive:    Circumstantial:  Flight of ideas:  Loose associations:    Obsessive thinking:  Distractible: X Tangential:      THOUGHT CONTENT Unremarkable: X Suicidal:  Obsessions:    Homicidal:  Delusions:  Hallucinations:    Suspicious:  Grandiose:  Phobias:      ORIENTATION Fully oriented: X Not oriented to person:  Not oriented to place:    Not oriented to time:  Not oriented to situation:        ATTENTION/ CONCENTRATION Adequate:  X Mildly distractible:  Moderately distractible:    Severely distractible:  Problems concentrating:        INTELLECT Suspected above average:  Suspected average:  X Suspected below average:    Known disability:  Uncertain:        MEMORY Within normal limits: X Impaired: Mild  Selective:      PERCEPTIONS Unremarkable:  X Auditory hallucinations:  Visual hallucinations:    Dissociation:  Traumatic flashbacks:  Ideas of reference:      JUDGEMENT Poor: X Fair:   Good:      INSIGHT Poor: X Fair:   Good:       IMPULSE CONTROL Adequate:  Needs to be addressed:  X Poor:  CLINICAL ASSESSMENT (risk of harm, recovery environment, functional status, diagnostic criteria met)  Risk of harm: Low   Recovery environment: Fair   Functional status: Fair Diagnostic criteria met: Yes    DIAGNOSIS   DSM-5 Code ICD-10 Code Diagnosis   300.02   F41.1  Generalized Anxiety Disorder

## 2019-07-08 ENCOUNTER — Encounter: Payer: Self-pay | Admitting: Licensed Clinical Social Worker

## 2019-07-09 ENCOUNTER — Ambulatory Visit: Payer: Self-pay | Admitting: Licensed Clinical Social Worker

## 2019-07-09 DIAGNOSIS — F411 Generalized anxiety disorder: Secondary | ICD-10-CM

## 2019-07-10 NOTE — Clinical Social Work Note (Deleted)
Patient Description:Patient was dressed appropriately, neat, good eye contact with normal affect, with speech mildly agitated with sporadic thoughts process.  Subjective Complaint:Patient states she is still concerned about her family's financial situation.  Patient states she is upset with her husband because he was pulled over on Friday and he was driving with an open beer bottle and without a license.  Patient states there was some question as to why her husband was pulled over since he was not speeding, and she suspects there may have been racial profiling. Patient states she is having a difficult time dealing with her daughter's growing independence and her daughter's lack of communication about what she is doing. Patient states she would like to go out with her husband more but he does not like to go out, and this is a source of contention for her. Patient state she is still having difficulty sleeping well and through the night.  Patient states part of the difficulty stems from daughter's coming home late.   Patient states husband will not allow her to work outside of the home, regardless of their financial situation, because he becomes possessive and jealous, when she works with other men. Patient states she is still experiencing anxiety and their financial situation is a main reason for that.  Patient states anxiety has not gotten worse but it is still an issue.  Objective Finding:  Patient was attentive during session with good eye contact. Patient was mildly agitated and irritable, which increased when talking about relationship with husband. Patient's thought process was mildly scattered and thoughts jumped from subject to subject.  Assessment of Progress:Patient continues to have difficulty with insight. Patient refuses to speak with her husband about going out together more often but still expresses resentment for his refusal to go out. Patient would like to work outside of the home to  improve their financial situation but husband does not want her to work outside of the home. 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 Patient expressed concerned about the school year for her sons.  We discussed different options to help her keep up with her sons' work and classes.  We also discussed different ways she could speak to her husband about her going to work outside of the home.   Plans for Next Session: Patient will discuss with her husband different options about her going to work out of the home.  Patient will use "I statements", when addressing her concerns with her daughter about her lack of communication. Patient will continue to journal her feelings and thoughts, when anxiety is elevated.

## 2019-07-10 NOTE — Clinical Social Work Placement (Deleted)
Patient Description:Patient was dressed appropriately, neat, good eye contact with normal affect, with speech mildly agitated with sporadic thoughts process.  Subjective Complaint:Patient states she is still concerned about her family's financial situation.  Patient states she is upset with her husband because he was pulled over on Friday and he was driving with an open beer bottle and without a license.  Patient states there was some question as to why her husband was pulled over since he was not speeding, and she suspects there may have been racial profiling. Patient states she is having a difficult time dealing with her daughter's growing independence and her daughter's lack of communication about what she is doing. Patient states she would like to go out with her husband more but he does not like to go out, and this is a source of contention for her. Patient state she is still having difficulty sleeping well and through the night.  Patient states part of the difficulty stems from daughter's coming home late.   Patient states husband will not allow her to work outside of the home, regardless of their financial situation, because he becomes possessive and jealous, when she works with other men. Patient states she is still experiencing anxiety and their financial situation is a main reason for that.  Patient states anxiety has not gotten worse but it is still an issue.  Objective Finding:  Patient was attentive during session with good eye contact. Patient was mildly agitated and irritable, which increased when talking about relationship with husband. Patient's thought process was mildly scattered and thoughts jumped from subject to subject.  Assessment of Progress:Patient continues to have difficulty with insight. Patient refuses to speak with her husband about going out together more often but still expresses resentment for his refusal to go out. Patient would like to work outside of the home to  improve their financial situation but husband does not want her to work outside of the home. 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 Patient expressed concerned about the school year for her sons.  We discussed different options to help her keep up with her sons' work and classes.  We also discussed different ways she could speak to her husband about her going to work outside of the home.   Plans for Next Session: Patient will discuss with her husband different options about her going to work out of the home.  Patient will use "I statements", when addressing her concerns with her daughter about her lack of communication. Patient will continue to journal her feelings and thoughts, when anxiety is elevated. 

## 2019-07-10 NOTE — Progress Notes (Addendum)
Patient Description:Patient was dressed appropriately, neat, good eye contact with normal affect, with speech mildly agitated with sporadic thoughts process.  Subjective Complaint:Patient states she is still concerned about her family's financial situation.  Patient states she is upset with her husband because he was pulled over on Friday and he was driving with an open beer bottle and without a license.  Patient states there was some question as to why her husband was pulled over since he was not speeding, and she suspects there may have been racial profiling. Patient states she is having a difficult time dealing with her daughter's growing independence and her daughter's lack of communication about what she is doing. Patient states she would like to go out with her husband more but he does not like to go out, and this is a source of contention for her. Patient state she is still having difficulty sleeping well and through the night.  Patient states part of the difficulty stems from daughter's coming home late.   Patient states husband will not allow her to work outside of the home, regardless of their financial situation, because he becomes possessive and jealous, when she works with other men. Patient states she is still experiencing anxiety and their financial situation is a main reason for that.  Patient states anxiety has not gotten worse but it is still an issue.  Objective Finding:  Patient was attentive during session with good eye contact. Patient was mildly agitated and irritable, which increased when talking about relationship with husband. Patient's thought process was mildly scattered and thoughts jumped from subject to subject.  Assessment of Progress:Patient continues to have difficulty with insight. Patient refuses to speak with her husband about going out together more often but still expresses resentment for his refusal to go out. Patient would like to work outside of the home to  improve their financial situation but husband does not want her to work outside of the home. 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 Patient expressed concerned about the school year for her sons.  We discussed different options to help her keep up with her sons' work and classes.  We also discussed different ways she could speak to her husband about her going to work outside of the home.   Plans for Next Session: Patient will discuss with her husband different options about her going to work out of the home.  Patient will use "I statements", when addressing her concerns with her daughter about her lack of communication. Patient will continue to journal her feelings and thoughts, when anxiety is elevated. Client Description (mark with X if observed)  APPEARANCE/DRESS  Neat: x Good hygiene: x Age appropriate:  x   Sloppy:  Fair hygiene:   Eccentric:    Relaxed:  x Poor hygiene:       BEHAVIOR Attentive:  x Passive:   Adequate eye contact:  x   Guarded:   Defensive:   Minimal eye contact:    Cooperative:  Hostile/irritable:  No eye contact:     MOTOR Hyper:  Hypo:  Rapid:    Agitated:  x Tics:  Tremors:    Lethargic:  Calm:       LANGUAGE Unremarkable: x Pressured:  Expressive intact:     Mute:  Slurred:  Receptive intact:      AFFECT/ MOOD  Calm:  Anxious:  x Inappropriate:    Depressed:   Flat:  Elevated:    Labile:  Agitated: x Hypervigilant:     THOUGHT FORM Unremarkable:  Illogical:  Indecisive:    Circumstantial: x Flight of ideas: x Loose associations:     Obsessive thinking:  Distractible:  Tangential:      THOUGHT CONTENT Unremarkable:  x Suicidal:  Obsessions:    Homicidal:  Delusions:  Hallucinations:    Suspicious:  Grandiose:  Phobias:      ORIENTATION Fully oriented:  x Not oriented to person:  Not oriented to place:    Not  oriented to time:  Not oriented to situation:        ATTENTION/ CONCENTRATION Adequate:  Mildly distractible:  x Moderately distractible:    Severely distractible:  Problems concentrating:        MEMORY Within normal limits: x  Impaired:  Selective:      PERCEPTIONS Unremarkable:  x Auditory hallucinations:  Visual hallucinations:    Dissociation:  Traumatic flashbacks:  Ideas of reference:      JUDGEMENT Poor:   Fair: x Good:      INSIGHT Poor:   Fair: x Good:      IMPULSE CONTROL Adequate: x Needs to be addressed:   Poor:

## 2019-07-12 ENCOUNTER — Telehealth: Payer: Self-pay | Admitting: Licensed Clinical Social Worker

## 2019-07-23 ENCOUNTER — Other Ambulatory Visit: Payer: Self-pay | Admitting: Licensed Clinical Social Worker

## 2019-07-25 ENCOUNTER — Other Ambulatory Visit: Payer: Self-pay | Admitting: Licensed Clinical Social Worker

## 2019-07-30 DIAGNOSIS — F411 Generalized anxiety disorder: Secondary | ICD-10-CM | POA: Insufficient documentation

## 2019-08-06 ENCOUNTER — Other Ambulatory Visit: Payer: Self-pay | Admitting: Licensed Clinical Social Worker

## 2019-09-05 ENCOUNTER — Ambulatory Visit: Payer: Self-pay | Admitting: Internal Medicine

## 2019-09-12 ENCOUNTER — Encounter: Payer: Self-pay | Admitting: Internal Medicine

## 2019-09-12 ENCOUNTER — Other Ambulatory Visit: Payer: Self-pay

## 2019-09-12 ENCOUNTER — Ambulatory Visit (INDEPENDENT_AMBULATORY_CARE_PROVIDER_SITE_OTHER): Payer: Self-pay | Admitting: Internal Medicine

## 2019-09-12 VITALS — BP 122/68 | HR 80 | Temp 98.1°F | Resp 12 | Ht 61.0 in | Wt 146.0 lb

## 2019-09-12 DIAGNOSIS — Z23 Encounter for immunization: Secondary | ICD-10-CM

## 2019-09-12 DIAGNOSIS — E782 Mixed hyperlipidemia: Secondary | ICD-10-CM | POA: Insufficient documentation

## 2019-09-12 DIAGNOSIS — N951 Menopausal and female climacteric states: Secondary | ICD-10-CM

## 2019-09-12 DIAGNOSIS — B353 Tinea pedis: Secondary | ICD-10-CM

## 2019-09-12 DIAGNOSIS — F411 Generalized anxiety disorder: Secondary | ICD-10-CM

## 2019-09-12 DIAGNOSIS — B351 Tinea unguium: Secondary | ICD-10-CM | POA: Insufficient documentation

## 2019-09-12 DIAGNOSIS — K582 Mixed irritable bowel syndrome: Secondary | ICD-10-CM | POA: Insufficient documentation

## 2019-09-12 DIAGNOSIS — R7303 Prediabetes: Secondary | ICD-10-CM

## 2019-09-12 DIAGNOSIS — G479 Sleep disorder, unspecified: Secondary | ICD-10-CM

## 2019-09-12 MED ORDER — BLACK COHOSH 40 MG PO CAPS: ORAL_CAPSULE | ORAL | 0 refills | Status: DC

## 2019-09-12 MED ORDER — TERBINAFINE HCL 250 MG PO TABS: ORAL_TABLET | ORAL | 0 refills | Status: DC

## 2019-09-12 NOTE — Patient Instructions (Addendum)
For foot and toenail fungus:  Spray your shoes with Lysol or Lotrimin Antifungal spray when you start the medication for your feet.   Re-spray your the shoes you wear with each wearing and allow to dry before wearing again You will need to continue to spray the shoes you have during the infection of your toenails and feet have been thrown out due to wear.  Once your toenails and feet are clear of infection, the shoes you buy new do not necessarily need to be sprayed. Clean your shower floor once to twice daily with bleach containing cleaner.  [facility] [address]  [place], [place] [address] [phone] 40% off--Black Cohosh-please give recommendations for dosing for hot flashes and insomnia

## 2019-09-12 NOTE — Progress Notes (Signed)
    Subjective:    Patient ID: Olivia Atkinson, female   DOB: August 29, 1976, 43 y.o.   MRN: [id number]   HPI   1.  IBS:  Taking Metamucil daily and has helped with symptoms.  She uses the Dicyclomine only as needed--maybe once a week and only 1/4 tab when she takes as the 1/2 tab even seemed to strong.   2.  Hyperlipidemia:  Off Atorvastatin now since mid July to see if she can go without it.   She has been walking 40-45 minutes 4 times weekly. She has cut back on red meat, carbs, and eating more vegetables.    3.  Prediabetes:  A1C was 5.9%.  Lifestyle changes as noted above.    4.  Itchy bumps on toes and in between toes for 1 month.  Left great toenail with discoloration and thickening over 1 year.    5.  Sleep issues:  For one month, difficulty sleeping.  Feels hot all over when trying to sleep--has to take off covers and gown.  Then cold.  Then having headache behind her eyes as she is very tired.   Sometimes having hot flashes during day as well. Her periods are now irregular.  Having periods every 2-3 months.     Current Meds  Medication Sig  . dicyclomine (BENTYL) 20 MG tablet 1 tab every 6 hours as needed before meals for abdominal discomfort   No Known Allergies   Review of Systems    Objective:   BP 122/68 (BP Location: Left Arm, Patient Position: Sitting, Cuff Size: Normal)   Pulse 80   Temp 98.1 F (36.7 C) (Temporal)   Resp 12   Ht 5\' 1"  (1.549 m)   Wt 146 lb (66.2 kg)   LMP 08/30/2019   BMI 27.59 kg/m   Physical Exam  NAD HEENT:  PERRL, EOMI Chest:  CTA CV:  RRR without murmur or rub.  Radial and DP pulses normal and equal. Abd:  S, NT, No HSM or mass, + BS LE:  No edema:  No significant skin findings save for some flaking about nailbeds.  Left Great toenail with thickening and orange discoloration distally that is growing toward base at medial nail aspect.   Assessment & Plan  1.  GAD:  Doing well per patient with Adelene Amas, LCSW-A  and counseling.  2.  IBS:  Well controlled with fiber laxative and limited use of Dicyclomine.  3.  Perimenopause:  To try Black Cohosh from [facility].  Stay physically active.  Regular bedtime calming behaviors.  4.  Toenail onychomycosis and mild Tinea pedis:  Shoe and shower floor care.  Terbinafine 250 mg daily for 84 days.  Follow up with OV and hepatic profile in 6 and 12 weeks.  5.  Prediabetes:  Return for fasting labs in next week:  A1C  6.  Hyperlipidemia:  Labs as above in next week:  FLP  7.  HM: Td and Influenza vaccines.

## 2019-09-16 ENCOUNTER — Other Ambulatory Visit (INDEPENDENT_AMBULATORY_CARE_PROVIDER_SITE_OTHER): Payer: Self-pay

## 2019-09-16 ENCOUNTER — Other Ambulatory Visit: Payer: Self-pay

## 2019-09-16 DIAGNOSIS — R7303 Prediabetes: Secondary | ICD-10-CM

## 2019-09-16 DIAGNOSIS — E782 Mixed hyperlipidemia: Secondary | ICD-10-CM

## 2019-09-17 LAB — LIPID PANEL W/O CHOL/HDL RATIO
Cholesterol, Total: 234 mg/dL — ABNORMAL HIGH (ref 100–199)
HDL: 49 mg/dL (ref >39)
LDL Chol Calc (NIH): 158 mg/dL — ABNORMAL HIGH (ref 0–99)
Triglycerides: 147 mg/dL (ref 0–149)
VLDL Cholesterol Cal: 27 mg/dL (ref 5–40)

## 2019-09-17 LAB — HGB A1C W/O EAG: Hgb A1c MFr Bld: 5.6 % (ref 4.8–5.6)

## 2019-09-23 ENCOUNTER — Telehealth (INDEPENDENT_AMBULATORY_CARE_PROVIDER_SITE_OTHER): Payer: Self-pay | Admitting: Licensed Clinical Social Worker

## 2019-09-23 DIAGNOSIS — F5101 Primary insomnia: Secondary | ICD-10-CM

## 2019-09-23 DIAGNOSIS — F411 Generalized anxiety disorder: Secondary | ICD-10-CM

## 2019-09-23 NOTE — Progress Notes (Signed)
   THERAPY PROGRESS NOTE  Session Time: 9:00 - 10:00am   Participation Level: Active  Behavioral Response: CasualAlertAnxious and Irritable  Type of Therapy: Individual Therapy  Treatment Goals addressed: Anxiety and Coping  Interventions: Motivational Interviewing, Strength-based and Other: Communication with children  Summary: Olivia Atkinson is a 43 y.o. female who presents with anxiety and intermittent insomnia.  Patient reports she is still having issues with lack of sleep and is waking up frequently during the night. Patient reports she is still feeling very anxious and has difficulty controlling her worries.  Patient states she is constantly worries about her children and their safety. Patient states she specifically worries about her eldest daughter (21y/o) when she goes out late.  Patient states she stays awake waiting for her to arrive home. Patient states she gets frustrated easily and has trouble focusing on what she is doing because she is constantly worrying about everything else she has to do.  Suicidal/Homicidal: Nowithout intent/plan  Therapist Response:  The focus of today's session was on helping the patient increase insight and understanding and learning new techniques on how to cope with anxiety. The main therapeutic techniques used iwere motivational interviewing by helping the patient identify were she has choice on how to manage stressors, strength-based by helping patient identify positive ways she is managing stress, and communication by helping patient with assertive communication with her eldest daughter . Therapeutic efforts also included aiding the patient in identifying thoughts and concerns that trigger anxiety and insomnia. The importance of self-care was also introduced.  Mental status General appearance/Behavior: Casual Eye contact: Good Motor behavior: Normal Speech: Normal, Rate:WNL and Volume:WNL Level of consciousness: Alert Mood: Anxious  and Irritable Affect: Appropriate Anxiety level: Moderate Thought process: Coherent and Circumstantial Thought content: WNL Perception: Normal Judgment: Fair Insight: Present; Poor  Plan: Return again in 2 weeks.  Diagnosis: Generalized Anxiety Disorder and Insomnia    Adelene Amas, Latanya Presser 09/23/2019

## 2019-09-24 ENCOUNTER — Other Ambulatory Visit: Payer: Self-pay

## 2019-10-08 ENCOUNTER — Ambulatory Visit (INDEPENDENT_AMBULATORY_CARE_PROVIDER_SITE_OTHER): Payer: Self-pay | Admitting: Licensed Clinical Social Worker

## 2019-10-08 DIAGNOSIS — F411 Generalized anxiety disorder: Secondary | ICD-10-CM

## 2019-10-21 ENCOUNTER — Ambulatory Visit (INDEPENDENT_AMBULATORY_CARE_PROVIDER_SITE_OTHER): Payer: Self-pay | Admitting: Licensed Clinical Social Worker

## 2019-10-21 ENCOUNTER — Other Ambulatory Visit: Payer: Self-pay

## 2019-10-21 DIAGNOSIS — F411 Generalized anxiety disorder: Secondary | ICD-10-CM

## 2019-10-21 DIAGNOSIS — Z79899 Other long term (current) drug therapy: Secondary | ICD-10-CM

## 2019-10-22 LAB — HEPATIC FUNCTION PANEL
ALT: 16 IU/L (ref 0–32)
AST: 18 IU/L (ref 0–40)
Albumin: 4.2 g/dL (ref 3.8–4.8)
Alkaline Phosphatase: 55 IU/L (ref 39–117)
Bilirubin Total: <0.2 mg/dL (ref 0.0–1.2)
Bilirubin, Direct: 0.05 mg/dL (ref 0.00–0.40)
Total Protein: 7.1 g/dL (ref 6.0–8.5)

## 2019-10-22 NOTE — Progress Notes (Signed)
  THERAPY PROGRESS NOTE  Session Time: 9:00 - 10:00am   Participation Level: Active  Behavioral Response: CasualAlertAnxious and Irritable  Type of Therapy: Individual Therapy  Treatment Goals addressed: Anxiety and Coping  Interventions: Motivational Interviewing, Strength-based and Other: Communication with children  Summary: Olivia Atkinson is a 43 y.o. female who presents with anxiety and intermittent insomnia.  Patient reports she is still having issues with lack of sleep and is waking up frequently during the night. Patient reports she is still feeling very anxious and has difficulty controlling her worries.  Patient reports she is feeling better since our last session. 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 Patient reports she gets frustrated easily and has trouble focusing on what she is doing because she is constantly worrying about everything else she has to do.  Suicidal/Homicidal: Nowithout intent/plan  Therapist Response:  The focus of today's session was on continuing to assist the patient in increasing insight and understanding and learning new techniques on how to cope with anxiety. The main therapeutic techniques used were motivational interviewing by helping the patient identify were she has choice on how to manage stressors, strength-based by helping patient identify positive ways she is managing stress, and communication by helping patient with assertive communication with her husband . Therapeutic efforts also included aiding the patient in identifying thoughts and concerns that trigger anxiety and insomnia. The importance of organizing goals and tasks was also discussed.  Mental status General appearance/Behavior: Casual Eye contact: Good Motor behavior: Normal Speech: Normal, Rate:WNL and Volume:WNL Level of consciousness: Alert Mood: Anxious and  Irritable Affect: Appropriate Anxiety level: Moderate Thought process: Coherent and Circumstantial Thought content: WNL Perception: Normal Judgment: Fair Insight: Present; Poor  Plan: Return again in 2 weeks.  Diagnosis: Generalized Anxiety Disorder and Insomnia   Adelene Amas, Latanya Presser 10/22/2019

## 2019-10-23 ENCOUNTER — Telehealth: Payer: Self-pay | Admitting: Internal Medicine

## 2019-10-25 NOTE — Progress Notes (Signed)
   THERAPY PROGRESS NOTE  Session Time: 9:00 - 10:00am   Participation Level: Active  Behavioral Response: CasualAlertAnxious and Irritable  Type of Therapy: Individual Therapy  Treatment Goals addressed: Anxiety and Coping  Interventions: Motivational Interviewing, Strength-based and Other: Communication    Summary: Olivia Atkinson is a 43 y.o. female who presents with anxiety and intermittent insomnia.  Patient reports she is still having issues with lack of sleep and is waking up frequently during the night. Patient reports she is still feeling very anxious and has difficulty controlling her worries.  Patient reports a lot of her issues stem from not feeling satisfied with her marriage and lack of companionship. Patient reports she gets frustrated easily and has trouble focusing on what she is doing because she is constantly worrying about everything else she has to do. Patient reports she often has a difficult time organizing her day and is often easily overwhelmed.  Suicidal/Homicidal: Nowithout intent/plan  Therapist Response:  The focus of today's session was on helping the patient increase insight and understanding and learning new techniques on how to cope with anxiety. The main therapeutic techniques used were motivational interviewing by helping the patient identify were she has choice on how to manage stressors, strength-based by helping patient identify positive ways she is managing stress, and communication by helping patient with assertive communication with her family. Therapeutic efforts also included aiding the patient in identifying thoughts and concerns that trigger anxiety and insomnia. The importance of making goals and tasks was discussed.  Mental status General appearance/Behavior: Casual Eye contact: Good Motor behavior: Normal Speech: Normal, Rate:WNL and Volume:WNL Level of consciousness: Alert Mood: Anxious and Irritable Affect: Appropriate Anxiety  level: Moderate Thought process: Coherent and Circumstantial Thought content: WNL Perception: Normal Judgment: Fair Insight: Present; Poor  Plan: Return again in 2 weeks.  Diagnosis: Generalized Anxiety Disorder and Insomnia    Adelene Amas, LCSWA 10/25/2019

## 2019-11-04 ENCOUNTER — Other Ambulatory Visit: Payer: Self-pay | Admitting: Licensed Clinical Social Worker

## 2019-11-05 ENCOUNTER — Telehealth: Payer: Self-pay | Admitting: Licensed Clinical Social Worker

## 2019-11-05 DIAGNOSIS — F5101 Primary insomnia: Secondary | ICD-10-CM

## 2019-11-05 DIAGNOSIS — F411 Generalized anxiety disorder: Secondary | ICD-10-CM

## 2019-11-05 NOTE — Progress Notes (Signed)
   THERAPY PROGRESS NOTE    Session Time: 9:00   Participation Level: Active  Behavioral Response: CasualAlertPositive  Type of Therapy: Individual Therapy  Treatment Goals addressed: Anxiety and Coping  Interventions: Motivational Interviewing, Strength-based and Other: Communication with daughter.  Summary: Olivia Atkinson is a 43 y.o. female who presents with anxiety and intermittent insomnia.  Patient reports she is feeling much better and experiencing less insomnia. Patient reports she she had an open and frank conversation with husband about how she felt about their relationship. Patient reports she feels better about her relationship with her husband, "Even if he doesn't change, I don't need to keep my feelings to myself anymore". Patient reports since talking to her husband she has felt less anxiety and stress. 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 Patient reports she is feeling excited about running her own business and is determined to make it work. Patient reports she has been trying to be more open with her daughter about her concerns for her and her anxiety for her well-being.  Patient reports daughter has been more open to talking but think she is still dealing with resentment.  Suicidal/Homicidal: Nowithout intent/plan  Therapist Response:  The focus of today's session was on continuing to assist the patient in increasing insight and understanding and continuing to use the techniques she has been using to cope with anxiety. The main therapeutic techniques used were motivational interviewing by helping the patient identify were she has choice on how to manage stressors, strength-based by helping patient identify positive ways she is managing stress, and communication by helping patient with assertive communication with her daughter. Therapeutic efforts also  included aiding the patient in identifying thoughts and concerns that trigger anxiety and insomnia. The importance of organizing goals and tasks was also discussed.  Mental status General appearance/Behavior: Casual Eye contact: Good Motor behavior: Normal Speech: Normal, Rate:WNL and Volume:WNL Level of consciousness: Alert Mood: Anxious and Irritable Affect: Appropriate Anxiety level: Moderate Thought process: Coherent and Circumstantial Thought content: WNL Perception: Normal Judgment: Fair Insight: Present; Fair  Plan: Return again in 2 weeks.  Diagnosis: Generalized Anxiety Disorder and Insomnia       Adelene Amas, Latanya Presser 11/05/2019

## 2019-11-18 NOTE — Telephone Encounter (Signed)
done

## 2019-11-19 ENCOUNTER — Telehealth (INDEPENDENT_AMBULATORY_CARE_PROVIDER_SITE_OTHER): Payer: Self-pay | Admitting: Licensed Clinical Social Worker

## 2019-11-19 ENCOUNTER — Other Ambulatory Visit: Payer: Self-pay

## 2019-11-19 DIAGNOSIS — F411 Generalized anxiety disorder: Secondary | ICD-10-CM

## 2019-11-19 DIAGNOSIS — F5101 Primary insomnia: Secondary | ICD-10-CM

## 2019-11-19 NOTE — Progress Notes (Signed)
   THERAPY PROGRESS NOTE    Session Time: 9:09 - 10:00  Participation Level: Active  Behavioral Response: CasualAlertPositive  Type of Therapy: Individual Therapy  Treatment Goals addressed: Anxiety and Coping  Interventions: Motivational Interviewing, Strength-based and Other: Communication with family  Summary: Olivia Atkinson is a 43 y.o. female who presents with anxiety and intermittent insomnia.  Patient reports she is feeling better but had trouble sleeping for 2-3 days this week. Patient reports she was stressed out with holiday festivities and believes that is why she had trouble sleeping.  Patient reports sleep has improved in the last two days. Patient reports she is not feeling as anxious since having a frank discussion with her husband about their relatioship. Patient reports she is feeling stress due to her son's poor academic situation but is trying to set boundaries with him to encourage better grades and create consequences. Patient reports she has been trying to be more open with her daughter about her concerns for her and her anxiety for her well-being.  Patient reports her daughter has mentioned that she may want to see a therapist in the future.  Suicidal/Homicidal: Nowithout intent/plan  Therapist Response:  The focus of today's session was on continuing to assist the patient in increasing insight and understanding and continuing to use the techniques she has been using to cope with anxiety. The main therapeutic techniques used were motivational interviewing by helping the patient identify were she has choice on how to manage stressors, strength-based by helping patient identify positive ways she is managing stress, and communication by helping patient with assertive communication with her family. Therapeutic efforts also included aiding the patient in identifying thoughts and concerns that trigger anxiety and insomnia. The importance of organizing goals and tasks  was also discussed. Patient seems less distressed when talking about her marriage and is able to create boundaries and use assertive communication with her in-laws which are helping her manage her anxiety and stress.  Contacted patient to inform patient of [place] last day of employment, to terminate services and give options for continuation of care. Options for continuation of care/termination are:  . Place patient on Wait List to make an appointment with one the JMS[employer] interns, Rudene Anda or Danae Chen in January 2021. Marland Kitchen Place patient on wait list for new LCSW. Marland Kitchen Refer patient to outside therapist or agency. . Termination of mental health service with the clinic, at this time  Patient requested a referral to an outside agency.  SWK referred patient to [employer] program at [facility].  Mental status General appearance/Behavior: Casual Eye contact: Good Motor behavior: Normal Speech: Normal, Rate:WNL and Volume:WNL Level of consciousness: Alert, Mood: Anxious and Irritable Affect: Appropriate Anxiety level: Moderate Thought process: Coherent and Circumstantial Thought content: WNL Perception: Normal Judgment: Fair Insight: Present; Fair  Plan: See above.  Diagnosis: Generalized Anxiety Disorder and Insomnia      Adelene Amas, Latanya Presser 11/19/2019

## 2019-12-05 ENCOUNTER — Other Ambulatory Visit: Payer: Self-pay

## 2019-12-05 ENCOUNTER — Encounter: Payer: Self-pay | Admitting: Internal Medicine

## 2019-12-05 ENCOUNTER — Ambulatory Visit (INDEPENDENT_AMBULATORY_CARE_PROVIDER_SITE_OTHER): Payer: Self-pay | Admitting: Internal Medicine

## 2019-12-05 VITALS — BP 118/78 | HR 76 | Resp 12 | Ht 61.0 in | Wt 146.0 lb

## 2019-12-05 DIAGNOSIS — B351 Tinea unguium: Secondary | ICD-10-CM

## 2019-12-05 DIAGNOSIS — B353 Tinea pedis: Secondary | ICD-10-CM

## 2019-12-05 DIAGNOSIS — Z79899 Other long term (current) drug therapy: Secondary | ICD-10-CM

## 2019-12-05 MED ORDER — TEA TREE 100 % EX OIL: TOPICAL_OIL | CUTANEOUS | Status: DC

## 2019-12-05 NOTE — Progress Notes (Signed)
    Subjective:    Patient ID: Olivia Atkinson, female   DOB: 1976-01-28, 44 y.o.   MRN: [id number]   HPI   Toenail onychomycosis:  Has missed doses of Terbinafine here and there.  Has pills that will last until Jan 30th, a little over a  week more than she should have.   She is not sure the Terbinafine is working.  The bumps on her toes have resolved, but not sure the toenail is better.  Her right toes are completely unaffected.   She is still having difficulties with sleep even with the black cohosh.  Would like to try melatonin.   Current Meds  Medication Sig  . Black Cohosh 40 MG CAPS Use as directed to prevent menopausal symptoms  . dicyclomine (BENTYL) 20 MG tablet 1 tab every 6 hours as needed before meals for abdominal discomfort  . psyllium (METAMUCIL) 58.6 % packet Take 1 packet by mouth daily.  Marland Kitchen terbinafine (LAMISIL) 250 MG tablet 1 tab by mouth daily for 84 days.   No Known Allergies   Review of Systems    Objective:   BP 118/78 (BP Location: Left Arm, Patient Position: Sitting, Cuff Size: Normal)   Pulse 76   Resp 12   Ht 5\' 1"  (1.549 m)   Wt 146 lb (66.2 kg)   LMP 10/31/2019 (Approximate)   BMI 27.59 kg/m   Physical Exam  NAD Right foot and toenails all normal  Left tips of toes appear damp and mildly peeling.  White flaking about great toenail bed of great toe and little toe as well.  Horizontal white discoloration of great toenail scattered throughout the length.  The medial discoloration does not extend to base of nail--about 4 mm from base. Little toenail appears fully involved with thickening and disoloration.   Assessment & Plan  1.  Tinea pedis and Left onychomycosis:  Tinea pedis improved, but not clear her nails are significantly improved. Hepatic profile today She will complete the course of Terbinafine and then watch her nails for 3 months.  If no improvement or worsen, will get a nail sample for culture and consider treatment with  another anti fungal based on identification. Tea Tree oil and Gold bond foot cream to be massaged about toenails on left daily after shower.   To continue shoe and shower floor care.  2.  Insomnia:  To try melatonin.

## 2019-12-06 LAB — HEPATIC FUNCTION PANEL
ALT: 15 IU/L (ref 0–32)
AST: 16 IU/L (ref 0–40)
Albumin: 4.3 g/dL (ref 3.8–4.8)
Alkaline Phosphatase: 57 IU/L (ref 39–117)
Bilirubin Total: <0.2 mg/dL (ref 0.0–1.2)
Bilirubin, Direct: 0.04 mg/dL (ref 0.00–0.40)
Total Protein: 6.9 g/dL (ref 6.0–8.5)

## 2019-12-27 ENCOUNTER — Other Ambulatory Visit: Payer: Self-pay

## 2019-12-27 ENCOUNTER — Ambulatory Visit: Payer: Self-pay | Admitting: Internal Medicine

## 2019-12-27 ENCOUNTER — Encounter: Payer: Self-pay | Admitting: Internal Medicine

## 2019-12-27 VITALS — BP 112/78 | HR 72 | Resp 12 | Ht 61.0 in | Wt 142.0 lb

## 2019-12-27 DIAGNOSIS — M7062 Trochanteric bursitis, left hip: Secondary | ICD-10-CM

## 2019-12-27 DIAGNOSIS — M545 Low back pain, unspecified: Secondary | ICD-10-CM

## 2019-12-27 DIAGNOSIS — G479 Sleep disorder, unspecified: Secondary | ICD-10-CM

## 2019-12-27 MED ORDER — IBUPROFEN 600 MG PO TABS: ORAL_TABLET | ORAL | 0 refills | Status: DC

## 2019-12-27 NOTE — Progress Notes (Signed)
Subjective:    Patient ID: Olivia Atkinson, female   DOB: Dec 17, 1975, 44 y.o.   MRN: [id number]   HPI   Olivia Atkinson interpreting  Left hip and sciatic pain for 6 months:  No definite history of injury.  No definite history of overuse. Points to lateral upper left thigh.  Started out intermittent, but in past 2 months, seems constant.   Low left back pain started up about 1 month ago as well.  Was walking for 45 minutes daily in the summer through sometime in November.  Doesn't like walking in the cold.   Notes also having numbness of left heel when gets up in middle of night.  Apparently has noted only twice.  Sensation returns when she awakens for her day. She is unable to sleep on left side due to pain on lateral thigh.  No actual radicular complaints down leg.  Does give history of when giving birth to her last child 10 years ago.  Apparently, her son was 10 lbs at birth and she delivered vaginally.   Had a lot of bony pelvic pain. Walked "like a penguin"  Afterward.  Feels this may be related.  However, sounds like her gait and ability to run returned to normal with time.  Patient admits during exam she is self administering or having someone else administer what sounds like Vitamin B12 shots in left buttock.  Has had 5 this past year, the last 3 days earlier--tender over the area of injection.  States she gets them to avoid fatigue.  Current Meds  Medication Sig  . Black Cohosh 40 MG CAPS Use as directed to prevent menopausal symptoms  . dicyclomine (BENTYL) 20 MG tablet 1 tab every 6 hours as needed before meals for abdominal discomfort  . psyllium (METAMUCIL) 58.6 % packet Take 1 packet by mouth daily.  Marland Kitchen terbinafine (LAMISIL) 250 MG tablet 1 tab by mouth daily for 84 days.   No Known Allergies   Review of Systems    Objective:   BP 112/78 (BP Location: Left Arm, Patient Position: Sitting, Cuff Size: Normal)   Pulse 72   Resp 12   Ht 5\' 1"   (1.549 m)   Wt 142 lb (64.4 kg)   LMP 12/03/2019 (Approximate)   BMI 26.83 kg/m   Physical Exam  NAD Back:  Mild tenderness of multiple levels of L/S spine and paraspinous musculature. Left thigh:  Very tender over greater trochanter--reproduces pain down lateral thigh.   No overlying erythema or swelling. Left heel:  Mild tenderness over plantar heel and into arch medially.  No erythema or swelling. Neuro, LE:  Motor 5/5, DTRs 2+/4 throughout.     Assessment & Plan  1.  Left Greater trochanteric bursitis:  Went over stretches to perform twice daily.  Ibuprofen 600 mg twice daily for 14 days--with food. PT referral. Avoid sleeping on left side If no improvement in 1 month, to call and will set up corticosteroid bursa injection.  2.  Insomnia:  Once able, to get back outside for daily walk--put on layers for colder weather.  Patient states did not have sleep issues when she was walking daily.  3.  ?Vitamin B12 injections:  Encouraged patient to not obtain injections without knowing whether she really needs.  Also, concern that she is getting injections from a reputable source.  She states the woman injecting often was a 01/31/2020 in [facility].  Only brought up the injection as I palpated an  sore area on thigh where she received her last injection.  4.  Low back pain:  Suspect secondary to gait change with her thigh pain.  Face to face with patient for 1 hour

## 2019-12-27 NOTE — Patient Instructions (Signed)
Stretches of thigh for 10 repetitions, 20 counts each time twice daily.

## 2020-01-16 ENCOUNTER — Telehealth: Payer: Self-pay | Admitting: Internal Medicine

## 2020-01-16 NOTE — Telephone Encounter (Signed)
Patient called stating has having fatigue and body ache since last night; patient denied any cough, fever, headache, abdominal pain or diarrhea. Provided pt. With phone number and address to go and get test it at [facility].  Verbalized understanding.

## 2020-01-17 ENCOUNTER — Ambulatory Visit: Payer: Self-pay

## 2020-01-17 ENCOUNTER — Ambulatory Visit: Payer: Self-pay | Attending: Internal Medicine

## 2020-01-17 DIAGNOSIS — Z20822 Contact with and (suspected) exposure to covid-19: Secondary | ICD-10-CM

## 2020-01-18 LAB — NOVEL CORONAVIRUS, NAA: SARS-CoV-2, NAA: DETECTED — AB

## 2020-01-20 ENCOUNTER — Ambulatory Visit: Payer: Self-pay | Admitting: Internal Medicine

## 2020-01-27 ENCOUNTER — Telehealth: Payer: Self-pay | Admitting: Internal Medicine

## 2020-01-27 NOTE — Telephone Encounter (Signed)
Patient called stating got tested for covid19 with + results on 01/17/2020. Patient  requesting appointment to be seen  due to having burning and congested nose, Patient stated still with headache, fever and chills sometimes and sore throat.  Advised pt.to continue isolated but pt. Stated was told her period for that is over and do not need to get re-tested. Re-advised her with recommendations and after 10-15 days presenting with no symptoms get retested. Pt. Verbalized understanding

## 2020-01-28 NOTE — Telephone Encounter (Signed)
Left message for Olivia Atkinson with patient information for [name] to contact patient.

## 2020-01-29 ENCOUNTER — Ambulatory Visit: Payer: Self-pay | Attending: Internal Medicine

## 2020-01-29 DIAGNOSIS — Z20822 Contact with and (suspected) exposure to covid-19: Secondary | ICD-10-CM | POA: Insufficient documentation

## 2020-01-30 LAB — NOVEL CORONAVIRUS, NAA: SARS-CoV-2, NAA: NOT DETECTED

## 2020-02-04 ENCOUNTER — Other Ambulatory Visit: Payer: Self-pay

## 2020-02-25 ENCOUNTER — Other Ambulatory Visit: Payer: Self-pay

## 2020-02-25 DIAGNOSIS — E782 Mixed hyperlipidemia: Secondary | ICD-10-CM

## 2020-02-26 LAB — LIPID PANEL W/O CHOL/HDL RATIO
Cholesterol, Total: 222 mg/dL — ABNORMAL HIGH (ref 100–199)
HDL: 49 mg/dL (ref >39)
LDL Chol Calc (NIH): 146 mg/dL — ABNORMAL HIGH (ref 0–99)
Triglycerides: 151 mg/dL — ABNORMAL HIGH (ref 0–149)
VLDL Cholesterol Cal: 27 mg/dL (ref 5–40)

## 2020-03-05 ENCOUNTER — Ambulatory Visit: Payer: Self-pay | Admitting: Internal Medicine

## 2020-03-11 ENCOUNTER — Ambulatory Visit: Payer: Self-pay | Admitting: Internal Medicine

## 2020-03-11 ENCOUNTER — Encounter: Payer: Self-pay | Admitting: Internal Medicine

## 2020-03-11 ENCOUNTER — Other Ambulatory Visit: Payer: Self-pay

## 2020-03-11 ENCOUNTER — Encounter: Payer: Self-pay | Admitting: Clinical

## 2020-03-11 VITALS — BP 122/68 | HR 84 | Resp 12 | Ht 61.0 in | Wt 150.0 lb

## 2020-03-11 DIAGNOSIS — M545 Low back pain: Secondary | ICD-10-CM

## 2020-03-11 DIAGNOSIS — M7062 Trochanteric bursitis, left hip: Secondary | ICD-10-CM

## 2020-03-11 DIAGNOSIS — F411 Generalized anxiety disorder: Secondary | ICD-10-CM

## 2020-03-11 DIAGNOSIS — K582 Mixed irritable bowel syndrome: Secondary | ICD-10-CM

## 2020-03-11 DIAGNOSIS — E782 Mixed hyperlipidemia: Secondary | ICD-10-CM

## 2020-03-11 DIAGNOSIS — R5383 Other fatigue: Secondary | ICD-10-CM

## 2020-03-11 DIAGNOSIS — G8929 Other chronic pain: Secondary | ICD-10-CM

## 2020-03-11 DIAGNOSIS — G5623 Lesion of ulnar nerve, bilateral upper limbs: Secondary | ICD-10-CM

## 2020-03-11 NOTE — Progress Notes (Signed)
Subjective:    Patient ID: Olivia Atkinson, female   DOB: 09-Jun-1976, 44 y.o.   MRN: [id number]   HPI   Interpreter from [facility] interprets  1.  Left thigh/greater trochanteric bursitis: Resolved.  Heard from PT in February--was told they would get back with her in 2 months.  She had COVID illness when PT originally called, but she has not heard anything.  2.  Low back pain:  She really only notes this now when tired.  So, improved.  Ibuprofen helped significantly.  She has not taken any since the original 14 days.  She has not really gotten back outside to walk since her COVID vaccine as her heart beats fast and she gets out of breath.    3.  Anxiety:  Was counseling with Adelene Amas, LCSWA with good improvement.  Since her illness, she is having difficulty again.  She would like to start working with our new LCSW, Ileana Tol.  No suicidal ideation.  4.  Generally just not feeling well since COVID19:  Fatigued.  Poor motivation.  Oral intake is fine.  No melena or hematochezia.  Lot of bloating but improvement with 1/4 tab of Dicyclomine.  Not taking until she is already bloated, however. Felt her stools were loose with higher dosing.   She continues to have regular periods, but not heavy.    5.  By the way:  Her forearms and hands fall asleep when she is sleeping.  Waking her more often.  Able to shake her hands out with return of normal feeling. Usually lying on her back with elbows against mattress when this occurs.   Current Meds  Medication Sig  . cetirizine (ZYRTEC) 10 MG tablet Take 10 mg by mouth daily.  Marland Kitchen dicyclomine (BENTYL) 20 MG tablet 1 tab every 6 hours as needed before meals for abdominal discomfort  . psyllium (METAMUCIL) 58.6 % packet Take 1 packet by mouth daily.  . Tea Tree 100 % OIL mezcla con Gold Bond Foot cream y aplicar a sus unas una vez al dia   No Known Allergies   Review of Systems    Objective:   BP 122/68 (BP Location: Left Arm,  Patient Position: Sitting, Cuff Size: Normal)   Pulse 84   Resp 12   Ht 5\' 1"  (1.549 m)   Wt 150 lb (68 kg)   LMP 02/25/2020 (Approximate)   BMI 28.34 kg/m   Physical Exam  NAD Tearful at times/anxious appearing. HEENT:  PERRL, EOMI Neck:  Supple, No adenopathy, no thyromegaly Chest:  CTA CV:  RRR without murmur or rub.  Radial and DP pulses normal and equal. Abd:  S, NT, No HSM or mass, + BS Neuro:  A & O x 3, Motor of LE 5/5, DTRS 2+/4, gait normal.  Negative tinel and Phalen at median nerve, volar wrists and negative tinel at ulnar nerve at elbow MS:  NT over spinous processes, but tender over upper lumbar parapspinous musculature bilaterally. NT over left greater trochanter today.   Assessment & Plan  1.  Left Greater Trochanteric Bursitis:  Resolved  2.  Bilateral low back pain:  Muscular and exacerbated by COVID19 infection.  Ibuprofen 400-800 mg twice daily as needed with food.  Check in with PT to find out if trying to contact her still.  3.  Fatigue:  Likely due to loss of follow up for counseling and following COvID illness.  CBC, CMP, TSH.  4.  Anxiety and dysthymia:  Denies suicidal ideation.  Warm hand off to [employer], LCSW  5.  IBS:  Treat #4.  To take dicyclomine before midday meal to avoid bloating.  6.  Probable intermittent impingement of bilateral ulnar nerves at medial elbows.  Encouraged change of position with sleep and cushioning at elbow as well.  7.  COVID vaccination:  Prefers to wait until she is generally feeling better.  8.  Mild hyperlipidemia:  Notified still a bit high--needs to keep working on diet and physical activity as she is able .

## 2020-03-12 LAB — COMPREHENSIVE METABOLIC PANEL
ALT: 31 IU/L (ref 0–32)
AST: 27 IU/L (ref 0–40)
Albumin/Globulin Ratio: 1.5 (ref 1.2–2.2)
Albumin: 4.6 g/dL (ref 3.8–4.8)
Alkaline Phosphatase: 57 IU/L (ref 39–117)
BUN/Creatinine Ratio: 29 — ABNORMAL HIGH (ref 9–23)
BUN: 18 mg/dL (ref 6–24)
Bilirubin Total: <0.2 mg/dL (ref 0.0–1.2)
CO2: 24 mmol/L (ref 20–29)
Calcium: 9.7 mg/dL (ref 8.7–10.2)
Chloride: 102 mmol/L (ref 96–106)
Creatinine, Ser: 0.62 mg/dL (ref 0.57–1.00)
GFR calc Af Amer: 127 mL/min/{1.73_m2} (ref >59)
GFR calc non Af Amer: 110 mL/min/{1.73_m2} (ref >59)
Globulin, Total: 3 g/dL (ref 1.5–4.5)
Glucose: 109 mg/dL — ABNORMAL HIGH (ref 65–99)
Potassium: 4.1 mmol/L (ref 3.5–5.2)
Sodium: 137 mmol/L (ref 134–144)
Total Protein: 7.6 g/dL (ref 6.0–8.5)

## 2020-03-12 LAB — CBC WITH DIFFERENTIAL/PLATELET
Basophils Absolute: 0.1 10*3/uL (ref 0.0–0.2)
Basos: 2 %
EOS (ABSOLUTE): 0.2 10*3/uL (ref 0.0–0.4)
Eos: 2 %
Hematocrit: 36.9 % (ref 34.0–46.6)
Hemoglobin: 12.6 g/dL (ref 11.1–15.9)
Immature Grans (Abs): 0 10*3/uL (ref 0.0–0.1)
Immature Granulocytes: 0 %
Lymphocytes Absolute: 2.3 10*3/uL (ref 0.7–3.1)
Lymphs: 32 %
MCH: 30.2 pg (ref 26.6–33.0)
MCHC: 34.1 g/dL (ref 31.5–35.7)
MCV: 89 fL (ref 79–97)
Monocytes Absolute: 0.5 10*3/uL (ref 0.1–0.9)
Monocytes: 8 %
Neutrophils Absolute: 3.9 10*3/uL (ref 1.4–7.0)
Neutrophils: 56 %
Platelets: 279 10*3/uL (ref 150–450)
RBC: 4.17 x10E6/uL (ref 3.77–5.28)
RDW: 12.5 % (ref 11.7–15.4)
WBC: 7 10*3/uL (ref 3.4–10.8)

## 2020-03-12 LAB — TSH: TSH: 1.26 u[IU]/mL (ref 0.450–4.500)

## 2020-03-12 NOTE — Progress Notes (Signed)
LCSW met with patient following visit with PCP on 04/21 who would like to start counseling due to ongoing fatigue, and anxiety especially since her +Covid vaccine, patient shared she has been unable to go back to work due to anxiety and fear of getting sick. Patient shared she wants to learn to manage her anxiety symptoms. Patient shared she will for orange card application and call LCSW back to schedule an appointment. Patient okayed for LCSW to call back within a month if not heard back to see if she is still interested. Patient denied active SI/HI

## 2020-03-13 ENCOUNTER — Other Ambulatory Visit: Payer: Self-pay

## 2020-05-22 ENCOUNTER — Telehealth: Payer: Self-pay | Admitting: Clinical

## 2020-05-22 ENCOUNTER — Encounter: Payer: Self-pay | Admitting: Clinical

## 2020-05-22 NOTE — Telephone Encounter (Signed)
LCSW contacted patient to check in as noted more than a month ago. LCSW asked if still interested in counseling. Patient reports she is feeling better and notes with DR appt will report if any changes. Patient thanked LCSW for check in call.

## 2020-06-11 ENCOUNTER — Other Ambulatory Visit: Payer: Self-pay

## 2020-06-11 DIAGNOSIS — E782 Mixed hyperlipidemia: Secondary | ICD-10-CM

## 2020-06-12 ENCOUNTER — Other Ambulatory Visit: Payer: Self-pay

## 2020-06-12 LAB — LIPID PANEL W/O CHOL/HDL RATIO
Cholesterol, Total: 214 mg/dL — ABNORMAL HIGH (ref 100–199)
HDL: 52 mg/dL (ref >39)
LDL Chol Calc (NIH): 141 mg/dL — ABNORMAL HIGH (ref 0–99)
Triglycerides: 119 mg/dL (ref 0–149)
VLDL Cholesterol Cal: 21 mg/dL (ref 5–40)

## 2020-06-15 ENCOUNTER — Other Ambulatory Visit: Payer: Self-pay

## 2020-06-15 ENCOUNTER — Ambulatory Visit: Payer: Self-pay | Attending: Internal Medicine

## 2020-06-15 DIAGNOSIS — Z23 Encounter for immunization: Secondary | ICD-10-CM

## 2020-06-15 NOTE — Progress Notes (Signed)
   Covid-19 Vaccination Clinic  Name:  Rosette Reveal    MRN: [id number] DOB: 02/11/1976  06/15/2020  Ms. Vazquez-Garcia was observed post Covid-19 immunization for 15 minutes without incident. She was provided with Vaccine Information Sheet and instruction to access the V-Safe system.   Ms. April Manson was instructed to call 911 with any severe reactions post vaccine: Marland Kitchen Difficulty breathing  . Swelling of face and throat  . A fast heartbeat  . A bad rash all over body  . Dizziness and weakness   Immunizations Administered    Name Date Dose VIS Date Route   Pfizer COVID-19 Vaccine 06/15/2020  9:12 AM 0.3 mL 01/15/2019 Intramuscular   Manufacturer: [employer], [employer]   Lot: [id number]   NDC: [address]-0

## 2020-06-17 ENCOUNTER — Encounter: Payer: Self-pay | Admitting: Internal Medicine

## 2020-07-06 ENCOUNTER — Encounter: Payer: Self-pay | Admitting: Internal Medicine

## 2020-08-26 ENCOUNTER — Ambulatory Visit: Payer: Self-pay | Admitting: Internal Medicine

## 2020-08-26 ENCOUNTER — Other Ambulatory Visit: Payer: Self-pay | Admitting: Internal Medicine

## 2020-08-26 ENCOUNTER — Encounter: Payer: Self-pay | Admitting: Internal Medicine

## 2020-08-26 VITALS — BP 118/70 | HR 68 | Resp 14 | Ht 61.25 in | Wt 153.0 lb

## 2020-08-26 DIAGNOSIS — Z124 Encounter for screening for malignant neoplasm of cervix: Secondary | ICD-10-CM

## 2020-08-26 DIAGNOSIS — Z1231 Encounter for screening mammogram for malignant neoplasm of breast: Secondary | ICD-10-CM

## 2020-08-26 DIAGNOSIS — R519 Headache, unspecified: Secondary | ICD-10-CM

## 2020-08-26 DIAGNOSIS — Z0000 Encounter for general adult medical examination without abnormal findings: Secondary | ICD-10-CM

## 2020-08-26 DIAGNOSIS — K589 Irritable bowel syndrome without diarrhea: Secondary | ICD-10-CM

## 2020-08-26 DIAGNOSIS — K581 Irritable bowel syndrome with constipation: Secondary | ICD-10-CM | POA: Insufficient documentation

## 2020-08-26 MED ORDER — DICYCLOMINE HCL 20 MG PO TABS: ORAL_TABLET | ORAL | 11 refills | Status: DC

## 2020-08-26 NOTE — Patient Instructions (Signed)
Call clinic if you have another headache like the last--would like to see you when you are having it

## 2020-08-26 NOTE — Progress Notes (Signed)
Subjective:    Patient ID: Olivia Atkinson, female   DOB: [birth year], 44 y.o.   MRN: [id number]   HPI   [facility], interpreting.  CPE with pap  1.  Pap:  Thinks last perfromed several years ago at [facility].  Always normal.  2.  Mammogram:  Last 10/23/2018 and normal.  No family history of breast cancer.   3.  Osteoprevention:  Does drink milk daily, but not likely even one serving.  She is willing to drink 3-4 cups daily.  She does walk, but has pain below her right buttock with walking. When she walks on level ground, no problem.    4.  Guaiac Cards:  Never.    5.  Colonoscopy:  Never.  No family history of colon cancer.   6.  Immunizations:  Has not had influenza vaccine this year. Immunization History  Administered Date(s) Administered   Influenza,inj,Quad PF,6+ Mos 09/23/2016   Influenza,inj,quad, With Preservative 09/12/2019   PFIZER SARS-COV-2 Vaccination 05/22/2020, 06/15/2020   Td 09/12/2019   Tdap 08/08/2009     7.  Glucose/Cholesterol:  History of prediabetes, though last A1C 5.6% 10.2020.  Cholesterol a bit high, but not to level where she needs use medication.  Has changed diet and physical activity. Lipid Panel     Component Value Date/Time   CHOL 214 (H) 06/11/2020 0934   TRIG 119 06/11/2020 0934   HDL 52 06/11/2020 0934   CHOLHDL 4.8 Ratio 06/18/2008 2106   VLDL 31 06/18/2008 2106   LDLCALC 141 (H) 06/11/2020 0934   LABVLDL 21 06/11/2020 0934     Current Meds  Medication Sig   cetirizine (ZYRTEC) 10 MG tablet Take 10 mg by mouth daily.   Melatonin 10 MG TABS Take 10 mg by mouth at bedtime.   psyllium (METAMUCIL) 58.6 % packet Take 1 packet by mouth daily.   Tea Tree 100 % OIL mezcla con Gold Bond Foot cream y aplicar a sus unas una vez al dia   No Known Allergies  Past Medical History:  Diagnosis Date   Hypercholesteremia    IBS (irritable bowel syndrome)    Prediabetes 2011    History reviewed. No pertinent surgical  history.  Family History  Problem Relation Age of Onset   Diabetes Mother    Hypertension Mother    Hyperlipidemia Mother    Diabetes Maternal Grandfather    Cancer Paternal Grandmother    Breast cancer Neg Hx    Social History   Socioeconomic History   Marital status: Domestic Partner    Spouse name: Jose   Number of children: 3   Years of education: 6   Highest education level: 6th grade  Occupational History   Occupation: [employer].  Tobacco Use   Smoking status: Never   Smokeless tobacco: Never  Vaping Use   Vaping Use: Never used  Substance and Sexual Activity   Alcohol use: No   Drug use: No   Sexual activity: Yes    Birth control/protection: None  Other Topics Concern   Not on file  Social History Narrative   ** Merged History Encounter **       Lives at home with long term boyfriend as well as their 3 children And a dog, Designer, fashion/clothing   Social Determinants of Health   Financial Resource Strain: Not on file  Food Insecurity: Not on file  Transportation Needs: Not on file  Physical Activity: Not on file  Stress: Not on file  Social  Connections: Not on file  Intimate Partner Violence: Not on file     Review of Systems  Neurological:  Positive for headaches (See above.).       Last Saturday, developed a stabbing pain that went down the middle of her head and then curved around behind her ear.  Then had a sudden burst of cramping pain at her left parietal area. Had the pain for 3 days in a row.  Took ibuprofen 600 mg, which helped each day, but would return.  States after a dose of ibuprofen on Monday, went away and did not return No photophobia, phonophobia or nausea. She did also have an episode of vertigo after bending over the week earlier--gets that when stressed.  She is very stressed. Feels she is always nervous and stressed since a child.    Psychiatric/Behavioral:         Always stressed and nervous.      Objective:   BP 118/70 (BP  Location: Right Arm, Patient Position: Sitting, Cuff Size: Normal)   Pulse 68   Resp 14   Ht 5' 1.25" (1.556 m)   Wt 153 lb (69.4 kg)   LMP 08/18/2020 (Exact Date)   BMI 28.67 kg/m   Physical Exam Constitutional:      Appearance: Normal appearance.  HENT:     Head: Normocephalic and atraumatic.     Right Ear: Tympanic membrane, ear canal and external ear normal.     Left Ear: Tympanic membrane, ear canal and external ear normal.     Nose: Nose normal.     Mouth/Throat:     Mouth: Mucous membranes are moist.     Pharynx: Oropharynx is clear.  Eyes:     Extraocular Movements: Extraocular movements intact.     Conjunctiva/sclera: Conjunctivae normal.     Pupils: Pupils are equal, round, and reactive to light.     Comments: Discs sharp bilaterally.  Neck:     Thyroid: No thyroid mass or thyromegaly.  Cardiovascular:     Rate and Rhythm: Normal rate and regular rhythm.     Heart sounds: S1 normal and S2 normal. No murmur heard.   No friction rub. No S3 or S4 sounds.     Comments: No carotid bruits.  Carotid, radial, femoral, DP and PT pulses normal and equal.    Pulmonary:     Effort: Pulmonary effort is normal.     Breath sounds: Normal breath sounds.  Chest:  Breasts:    Right: No inverted nipple, mass, nipple discharge, skin change or tenderness.     Left: No inverted nipple, mass, nipple discharge, skin change or tenderness.  Abdominal:     General: Bowel sounds are normal.     Palpations: Abdomen is soft. There is no hepatomegaly, splenomegaly or mass.     Tenderness: There is no abdominal tenderness.     Hernia: No hernia is present.  Genitourinary:    Comments: Normal external female genitalia No vaginal discharge. Cervix with area of mild increased vascularity adjacent to os at 7 O'Clock.  Multiple passes with pap spatula and brush over this area. No uterine or adnexal mass or tenderness. Musculoskeletal:        General: Normal range of motion.     Cervical  back: Normal range of motion and neck supple.     Right lower leg: No edema.     Left lower leg: No edema.     Comments: Tender over bilateral traps and along cervical paraspinous  musculature to occipital ridge.  Lymphadenopathy:     Head:     Right side of head: No submental or submandibular adenopathy.     Left side of head: No submental or submandibular adenopathy.     Cervical: No cervical adenopathy.     Upper Body:     Right upper body: No supraclavicular or axillary adenopathy.     Left upper body: No supraclavicular or axillary adenopathy.     Lower Body: No right inguinal adenopathy. No left inguinal adenopathy.  Skin:    General: Skin is warm.     Capillary Refill: Capillary refill takes less than 2 seconds.     Findings: No rash.  Neurological:     Mental Status: She is alert and oriented to person, place, and time.     Cranial Nerves: Cranial nerves are intact.     Sensory: Sensation is intact.     Motor: Motor function is intact.     Coordination: Coordination is intact.     Gait: Gait is intact.     Deep Tendon Reflexes: Reflexes are normal and symmetric.  Psychiatric:        Mood and Affect: Mood is anxious.        Speech: Speech normal.        Behavior: Behavior normal. Behavior is cooperative.     Assessment & Plan  1.  CPE with pap Mammogram ordered. Guaiac cards x 3 to return in 2 weeks when in for fasting labs:  FLP, A1C Free flu vaccine at [employer] booster.  2.  Anxiety/stress:  Not interested in further counseling at this time.  Strongly urged her to reconsider as feel her headaches and abdominal discomforts largely due to internalizing stress/anxeity.  3.  Hx Prediabetes:  A1C as above.  4.  IBS:  Dicyclomine 20 mg every 6 hours as needed.    5.  Tension Headache:  most likely cause of recent headache:  Went over stretches of neck, good sleep habits/positions.  To call if another headache similar to last and will reevaluate.

## 2020-08-28 LAB — CYTOLOGY - PAP

## 2020-09-11 ENCOUNTER — Other Ambulatory Visit (INDEPENDENT_AMBULATORY_CARE_PROVIDER_SITE_OTHER): Payer: Self-pay

## 2020-09-11 DIAGNOSIS — R7303 Prediabetes: Secondary | ICD-10-CM

## 2020-09-11 DIAGNOSIS — E782 Mixed hyperlipidemia: Secondary | ICD-10-CM

## 2020-09-11 DIAGNOSIS — Z1211 Encounter for screening for malignant neoplasm of colon: Secondary | ICD-10-CM

## 2020-09-11 LAB — POC HEMOCCULT BLD/STL (HOME/3-CARD/SCREEN)
Card #2 Fecal Occult Blod, POC: NEGATIVE
Card #3 Fecal Occult Blood, POC: NEGATIVE
Fecal Occult Blood, POC: NEGATIVE

## 2020-09-12 LAB — LIPID PANEL W/O CHOL/HDL RATIO
Cholesterol, Total: 218 mg/dL — ABNORMAL HIGH (ref 100–199)
HDL: 50 mg/dL (ref >39)
LDL Chol Calc (NIH): 146 mg/dL — ABNORMAL HIGH (ref 0–99)
Triglycerides: 123 mg/dL (ref 0–149)
VLDL Cholesterol Cal: 22 mg/dL (ref 5–40)

## 2020-09-12 LAB — HEMOGLOBIN A1C
Est. average glucose Bld gHb Est-mCnc: 120 mg/dL
Hgb A1c MFr Bld: 5.8 % — ABNORMAL HIGH (ref 4.8–5.6)

## 2020-09-23 ENCOUNTER — Other Ambulatory Visit: Payer: Self-pay

## 2020-09-23 DIAGNOSIS — Z20822 Contact with and (suspected) exposure to covid-19: Secondary | ICD-10-CM

## 2020-09-24 LAB — SARS-COV-2, NAA 2 DAY TAT

## 2020-09-24 LAB — NOVEL CORONAVIRUS, NAA: SARS-CoV-2, NAA: NOT DETECTED

## 2020-10-28 ENCOUNTER — Telehealth: Payer: Self-pay | Admitting: Internal Medicine

## 2020-10-28 NOTE — Telephone Encounter (Signed)
Patient is been feeling every time she goes to bed dizziness while she is [place], When she get's up she has nausea and dizziness and losses stability while walking. Patient also mentioned that she has been feeling like her eye is palpitating all the time. Patient would like to see the doctor for recommendations.

## 2020-10-29 NOTE — Telephone Encounter (Signed)
Dulce discussed with Dr. Delrae Alfred- please schedule appointment for patient   Thank you!

## 2020-11-03 NOTE — Telephone Encounter (Signed)
An appointment was scheduled for the patient on 11/17/2020 @ 9:30am.

## 2020-11-17 ENCOUNTER — Other Ambulatory Visit: Payer: Self-pay

## 2020-11-17 ENCOUNTER — Ambulatory Visit (INDEPENDENT_AMBULATORY_CARE_PROVIDER_SITE_OTHER): Payer: Self-pay | Admitting: Internal Medicine

## 2020-11-17 ENCOUNTER — Ambulatory Visit: Payer: Self-pay | Admitting: Internal Medicine

## 2020-11-17 ENCOUNTER — Encounter: Payer: Self-pay | Admitting: Internal Medicine

## 2020-11-17 VITALS — BP 118/70 | HR 76 | Resp 16 | Ht 61.25 in | Wt 148.0 lb

## 2020-11-17 DIAGNOSIS — R112 Nausea with vomiting, unspecified: Secondary | ICD-10-CM

## 2020-11-17 DIAGNOSIS — R42 Dizziness and giddiness: Secondary | ICD-10-CM

## 2020-11-17 LAB — POCT URINE PREGNANCY: Preg Test, Ur: NEGATIVE

## 2020-11-24 ENCOUNTER — Ambulatory Visit
Admission: RE | Admit: 2020-11-24 | Discharge: 2020-11-24 | Disposition: A | Discharge status: Home / Self Care | Payer: Self-pay | Source: Ambulatory Visit | Attending: Internal Medicine | Admitting: Internal Medicine

## 2020-11-24 ENCOUNTER — Other Ambulatory Visit: Payer: Self-pay

## 2020-11-24 DIAGNOSIS — Z1231 Encounter for screening mammogram for malignant neoplasm of breast: Secondary | ICD-10-CM

## 2020-12-17 ENCOUNTER — Encounter (HOSPITAL_COMMUNITY): Payer: Self-pay

## 2020-12-17 ENCOUNTER — Ambulatory Visit (HOSPITAL_COMMUNITY)
Admission: EM | Admit: 2020-12-17 | Discharge: 2020-12-17 | Disposition: A | Discharge status: Home / Self Care | Payer: No Typology Code available for payment source | Attending: Family Medicine | Admitting: Family Medicine

## 2020-12-17 ENCOUNTER — Other Ambulatory Visit: Payer: Self-pay

## 2020-12-17 DIAGNOSIS — G8929 Other chronic pain: Secondary | ICD-10-CM

## 2020-12-17 DIAGNOSIS — M546 Pain in thoracic spine: Secondary | ICD-10-CM

## 2020-12-17 MED ORDER — MELOXICAM 15 MG PO TABS: 15.0000 mg | ORAL_TABLET | Freq: Every day | ORAL | 0 refills | Status: DC

## 2020-12-17 MED ORDER — TIZANIDINE HCL 4 MG PO TABS: 4.0000 mg | ORAL_TABLET | Freq: Four times a day (QID) | ORAL | 0 refills | Status: DC | PRN

## 2020-12-17 NOTE — ED Notes (Signed)
Used spanish interpreter for triage assessment

## 2020-12-17 NOTE — ED Triage Notes (Addendum)
Pt states she has been having back pain for two weeks, report the pain is stabbing and constant. Pt states after waking up the pain is burning and is all over her back. Patient does not report falling, or hurting herself. Pt takes 600 mg ibuprofen when she cannot stand the pain. It helps relief the pain some.   joint pain in both elbows with L elbow worse.  pt would like x ray done if possible.

## 2020-12-17 NOTE — ED Provider Notes (Signed)
[facility]    CSN: [id number] Arrival date & time: 12/17/20  0801      History   Chief Complaint Chief Complaint  Patient presents with  . Back Pain    HPI Olivia Atkinson is a 45 y.o. female.   HPI   This is a 45 year old Hispanic woman.  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  She did not have any work-related accident or injury.  Denies overuse.  Denies any fall.   Initially registered to be seen for back pain for 2 weeks.  Over the course of the interaction she revealed that she had Covid last February and she has been having back pain off and on ever since then.  She thinks that it may be from her Covid.  She states that the pain is in her middle back and points to the lower lung area in the back, lower thoracic region.  It hurts at times with deep breath.  Hurts at times with movement.  No shortness of breath.  No sputum production, cough, fever or chills.  What bothers her the most she will take ibuprofen 600 mg and this gives her some relief.  She states that she has seen her physician for this.  She states she has had physical therapy for this.  It appears this is a chronic medical problem.  Uncertain why she came in to be seen today.  Past Medical History:  Diagnosis Date  . Hypercholesteremia   . IBS (irritable bowel syndrome)   . Prediabetes 2011    Patient Active Problem List   Diagnosis Date Noted  . IBS (irritable bowel syndrome)   . Trochanteric bursitis of left hip 12/27/2019  . Acute left-sided low back pain without sciatica 12/27/2019  . Primary insomnia 09/23/2019  . Perimenopausal vasomotor symptoms 09/12/2019  . Tinea pedis of left foot 09/12/2019  . Onychomycosis of toenail 09/12/2019  . Sleep disorder 09/12/2019  . Mixed hyperlipidemia 09/12/2019  . Irritable bowel syndrome with both constipation and diarrhea 09/12/2019  . Generalized anxiety disorder 07/30/2019  . Prediabetes     History reviewed. No  pertinent surgical history.  OB History    Gravida  3   Para      Term      Preterm      AB      Living  3     SAB      IAB      Ectopic      Multiple      Live Births  3            Home Medications    Prior to Admission medications   Medication Sig Start Date End Date Taking? Authorizing Provider  meloxicam (MOBIC) 15 MG tablet Take 1 tablet (15 mg total) by mouth daily. 12/17/20  Yes Eustace Moore, MD  tiZANidine (ZANAFLEX) 4 MG tablet Take 1-2 tablets (4-8 mg total) by mouth every 6 (six) hours as needed for muscle spasms. 12/17/20  Yes Eustace Moore, MD  cetirizine (ZYRTEC) 10 MG tablet Take 10 mg by mouth daily.    [provider]  dicyclomine (BENTYL) 20 MG tablet 1 tab every 6 hours as needed before meals for abdominal discomfort 08/26/20   Julieanne Manson, MD  Melatonin 10 MG TABS Take 10 mg by mouth at bedtime.    [provider]  psyllium (METAMUCIL) 58.6 % packet Take 1 packet by mouth daily.  [provider]  Tea Tree 100 % OIL mezcla con Gold Bond Foot cream y aplicar a sus unas una vez al dia 12/05/19   Julieanne Manson, MD    Family History Family History  Problem Relation Age of Onset  . Diabetes Mother   . Hypertension Mother   . Hyperlipidemia Mother   . Diabetes Maternal Grandfather   . Cancer Paternal Grandmother   . Breast cancer Neg Hx     Social History Social History   Tobacco Use  . Smoking status: Never Smoker  . Smokeless tobacco: Never Used  Vaping Use  . Vaping Use: Never used  Substance Use Topics  . Alcohol use: No  . Drug use: No     Allergies   Patient has no known allergies.   Review of Systems Review of Systems See HPI  Physical Exam Triage Vital Signs ED Triage Vitals  Enc Vitals Group     BP 12/17/20 0821 115/73     Pulse Rate 12/17/20 0821 88     Resp --      Temp 12/17/20 0821 98.2 F (36.8 C)     Temp Source 12/17/20 0821 Oral     SpO2 12/17/20 0821  98 %     Weight --      Height --      Head Circumference --      Peak Flow --      Pain Score 12/17/20 0816 4     Pain Loc --      Pain Edu? --      Excl. in GC? --    No data found.  Updated Vital Signs BP 115/73 (BP Location: Right Arm)   Pulse 88   Temp 98.2 F (36.8 C) (Oral)   LMP 12/08/2020   SpO2 98%     Physical Exam Constitutional:      General: She is not in acute distress.    Appearance: She is well-developed, normal weight and well-nourished.     Comments: Patient appears in no acute distress.  Walks with a normal gait.  There is no tenderness to palpation of the lumbar muscles.  There is no tenderness to pressure of the ribs.  Lungs are clear to auscultation.  Abdominal exam is benign with no tenderness.  HENT:     Head: Normocephalic and atraumatic.     Mouth/Throat:     Mouth: Oropharynx is clear and moist.  Eyes:     Conjunctiva/sclera: Conjunctivae normal.     Pupils: Pupils are equal, round, and reactive to light.  Cardiovascular:     Rate and Rhythm: Normal rate.  Pulmonary:     Effort: Pulmonary effort is normal. No respiratory distress.  Abdominal:     General: There is no distension.     Palpations: Abdomen is soft.  Musculoskeletal:        General: No edema. Normal range of motion.     Cervical back: Normal range of motion.     Comments: Strength sensation range of motion reflexes are normal in both lower extremities  Skin:    General: Skin is warm and dry.  Neurological:     Mental Status: She is alert.     Gait: Gait normal.  Psychiatric:        Mood and Affect: Mood normal.      UC Treatments / Results  Labs (all labs ordered are listed, but only abnormal results are displayed) Labs Reviewed - No data to display  EKG   Radiology No results found.  Procedures Procedures (including critical care time)  Medications Ordered in UC Medications - No data to display  Initial Impression / Assessment and Plan / UC Course  I have  reviewed the triage vital signs and the nursing notes.  Pertinent labs & imaging results that were available during my care of the patient were reviewed by me and considered in my medical decision making (see chart for details).     I explained to the patient that thoracic back pain of 1 year duration is clearly a medical problem that requires work-up by her physician.  I will give her medication to help her for now, she needs to follow-up with her PCP Final Clinical Impressions(s) / UC Diagnoses   Final diagnoses:  Chronic bilateral thoracic back pain     Discharge Instructions     Take the meloxicam once  a day This is an anti inflammatory pain medicine Do not take ibuprofen while on Take the tizanidine at bedtime This will help with sleep and with pain in the morning Gentle stretching once a day may help See your doctor for follow up   ED Prescriptions    Medication Sig Dispense Auth. Provider   meloxicam (MOBIC) 15 MG tablet Take 1 tablet (15 mg total) by mouth daily. 30 tablet Eustace Moore, MD   tiZANidine (ZANAFLEX) 4 MG tablet Take 1-2 tablets (4-8 mg total) by mouth every 6 (six) hours as needed for muscle spasms. 21 tablet Eustace Moore, MD     PDMP not reviewed this encounter.   Eustace Moore, MD 12/17/20 [phone]

## 2020-12-17 NOTE — Discharge Instructions (Signed)
Take the meloxicam once  a day This is an anti inflammatory pain medicine Do not take ibuprofen while on Take the tizanidine at bedtime This will help with sleep and with pain in the morning Gentle stretching once a day may help See your doctor for follow up

## 2021-01-26 ENCOUNTER — Encounter (HOSPITAL_COMMUNITY): Payer: Self-pay

## 2021-01-27 ENCOUNTER — Other Ambulatory Visit: Payer: Self-pay

## 2021-01-27 ENCOUNTER — Encounter: Payer: Self-pay | Admitting: Internal Medicine

## 2021-01-27 ENCOUNTER — Ambulatory Visit: Payer: Self-pay | Admitting: Internal Medicine

## 2021-01-27 VITALS — BP 126/72 | HR 80 | Resp 20 | Ht 61.25 in | Wt 150.8 lb

## 2021-01-27 DIAGNOSIS — M7712 Lateral epicondylitis, left elbow: Secondary | ICD-10-CM

## 2021-01-27 DIAGNOSIS — G5603 Carpal tunnel syndrome, bilateral upper limbs: Secondary | ICD-10-CM

## 2021-01-27 DIAGNOSIS — M7702 Medial epicondylitis, left elbow: Secondary | ICD-10-CM

## 2021-01-27 DIAGNOSIS — M7711 Lateral epicondylitis, right elbow: Secondary | ICD-10-CM

## 2021-01-27 DIAGNOSIS — M7701 Medial epicondylitis, right elbow: Secondary | ICD-10-CM

## 2021-01-27 MED ORDER — IBUPROFEN 200 MG PO TABS: ORAL_TABLET | ORAL | 0 refills | Status: DC

## 2021-01-27 NOTE — Progress Notes (Signed)
    Subjective:    Patient ID: Olivia Atkinson, female   DOB: 01/17/1976, 45 y.o.   MRN: [id number]   HPI   Daughter interprets  Problems with bilateral elbows causing her pain.  Started in January.  Has had this before.  No redness or swelling of elbows. Hurts to lift objects with her arm outstretched in particular Feels the pain on both lateral and medial aspects of elbow.  Cannot say one hurts more then the other. Started in left elbow first.  She is right handed. She works as a Neurosurgeon. Cannot recall overdoing it with work or any other activity before January.   She was taking Meloxicam for her back at one point during the onset of elbow pain, but did not seem to improve her elbow pain. Has not taken anything else for the pain.  Has also noted her hands falling asleep when she is sleeping.  She shakes them out and feeling returns.        Current Meds  Medication Sig  . cetirizine (ZYRTEC) 10 MG tablet Take 10 mg by mouth daily.  Marland Kitchen dicyclomine (BENTYL) 20 MG tablet 1 tab every 6 hours as needed before meals for abdominal discomfort  . Melatonin 10 MG TABS Take 10 mg by mouth at bedtime.  . meloxicam (MOBIC) 15 MG tablet Take 1 tablet (15 mg total) by mouth daily.  . psyllium (METAMUCIL) 58.6 % packet Take 1 packet by mouth daily.  . Tea Tree 100 % OIL mezcla con Gold Bond Foot cream y aplicar a sus unas una vez al dia  . tiZANidine (ZANAFLEX) 4 MG tablet Take 1-2 tablets (4-8 mg total) by mouth every 6 (six) hours as needed for muscle spasms.   No Known Allergies   Review of Systems    Objective:   BP 126/72 (BP Location: Right Arm, Patient Position: Sitting, Cuff Size: Normal)   Pulse 80   Resp 20   Ht 5' 1.25" (1.556 m)   Wt 150 lb 12 oz (68.4 kg)   BMI 28.25 kg/m   Physical Exam  NAD No erythema or swelling of bilateral elbows.   Full ROM bilaterally of elbows. Tender over bilateral lateral and medial epicondyles and proximal forearm musculature  on both sides.  Left is significantly more tender than on right. Pain with pronation and supination against opposing force with left elbow--most of the discomfort in lateral epicondylar area. No Tinels over ulnar nerves and medial epicondyle areas bilaterally.  Negative Tinels and Phalens over median nerves at volar wrist.   Good grip bilaterally    Assessment & Plan  1.  Bilateral Medial and Lateral Epidondylitis, Left more pronounced than right.  Ibuprofen 600 mg twice daily with meals for 2 weeks, then as needed.  To hold Meloxicam while taking Ibuprofen.  Epicondylitis splint to use mainly laterally on left and medially on right, but can alternate which side of forearm to apply as needed.    2.  CTS vs ulnar neuropathy: No findings for CTS or ulnar neuropathy--to pay attention to know what splint or cushion to obtain.

## 2021-01-27 NOTE — Patient Instructions (Addendum)
Epicondylitis splint for both elbows--apply to side that is bothering you the most. Wear splints all day when doing work with hands/arms Once you know whether your thumb side or pinky side goes to sleep first, let me know and will discuss cushion or splint needed.  Do not take Meloxicam on same day you take ibuprofen.

## 2021-02-02 ENCOUNTER — Ambulatory Visit: Payer: Self-pay | Admitting: Internal Medicine

## 2021-03-08 IMAGING — MG DIGITAL SCREENING BILAT W/ TOMO W/ CAD
6 of 10 series · 6 of 30 positions shown · non-contrast
Comparison: Previous exam(s).

CLINICAL DATA: Screening.

EXAM:
DIGITAL SCREENING BILATERAL MAMMOGRAM WITH TOMO AND CAD

[R CC synth-2D]
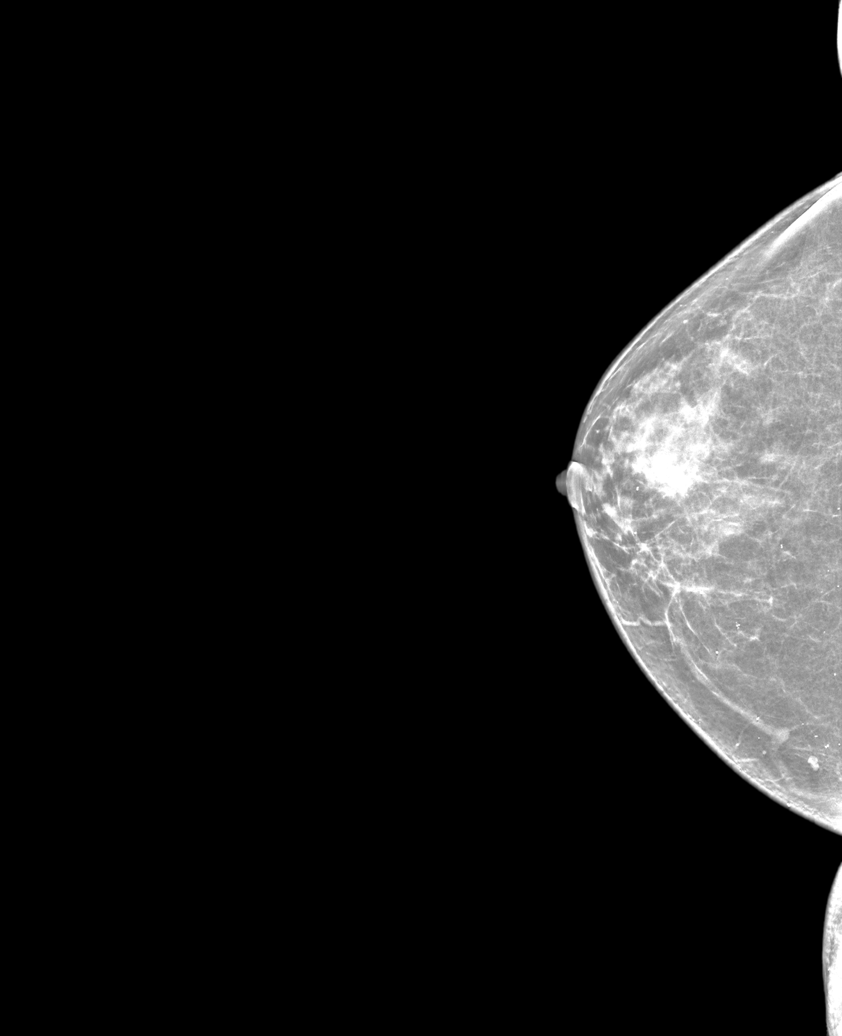

[R MLO synth-2D (1 of 2)]
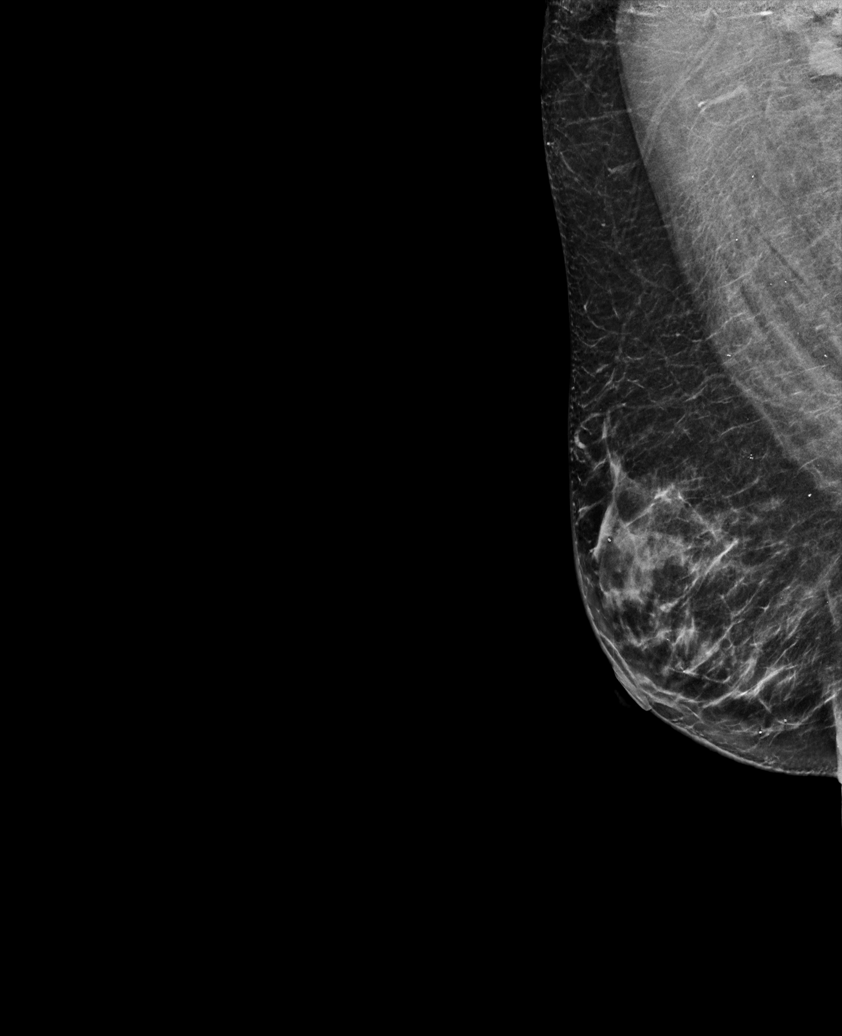

[L CC synth-2D]
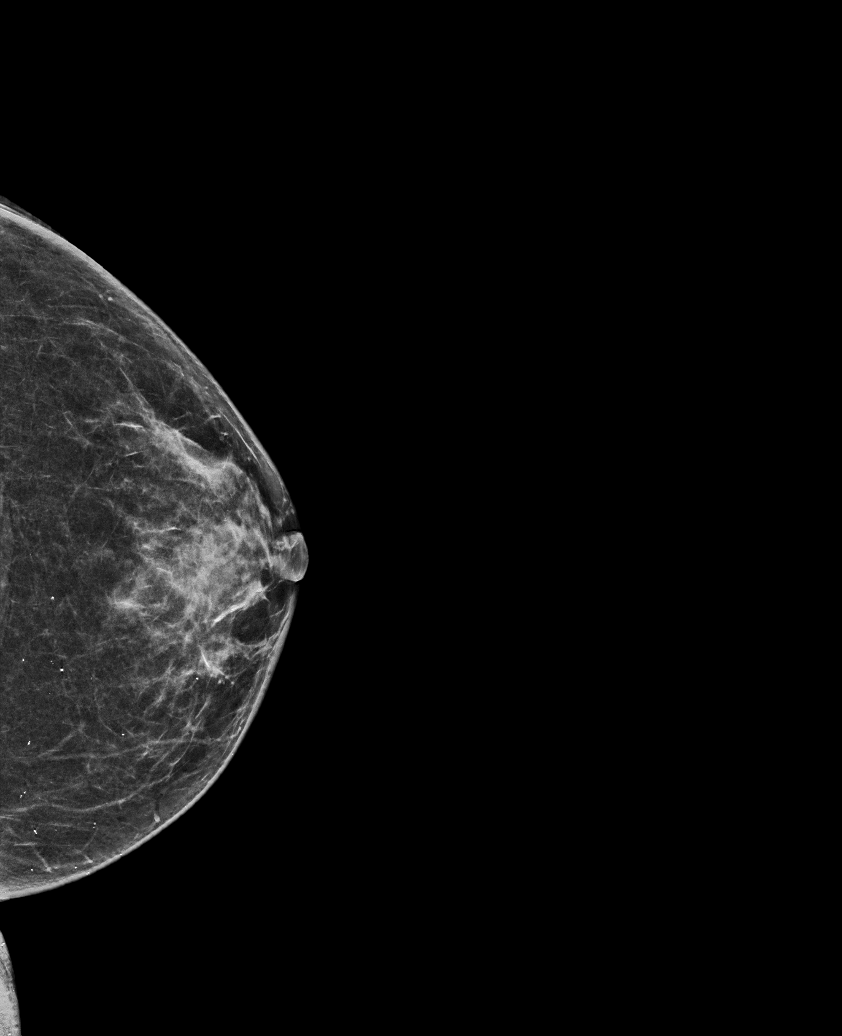

[R MLO synth-2D (2 of 2)]
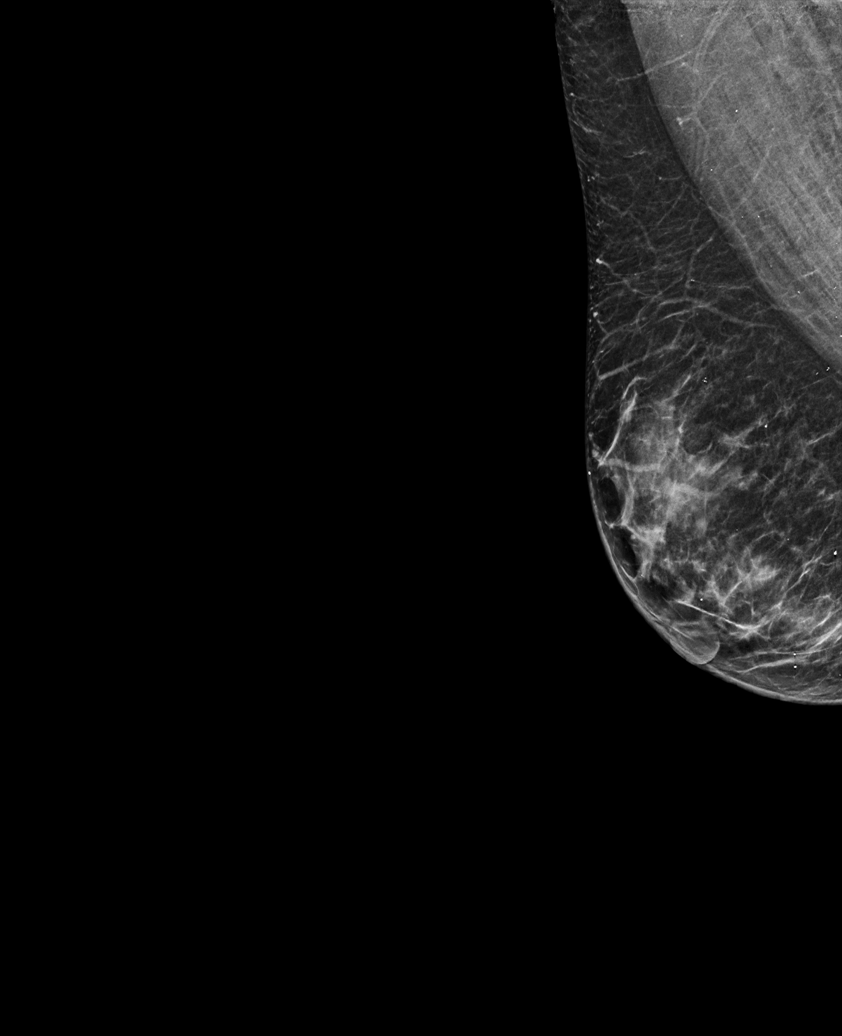

[L MLO synth-2D]
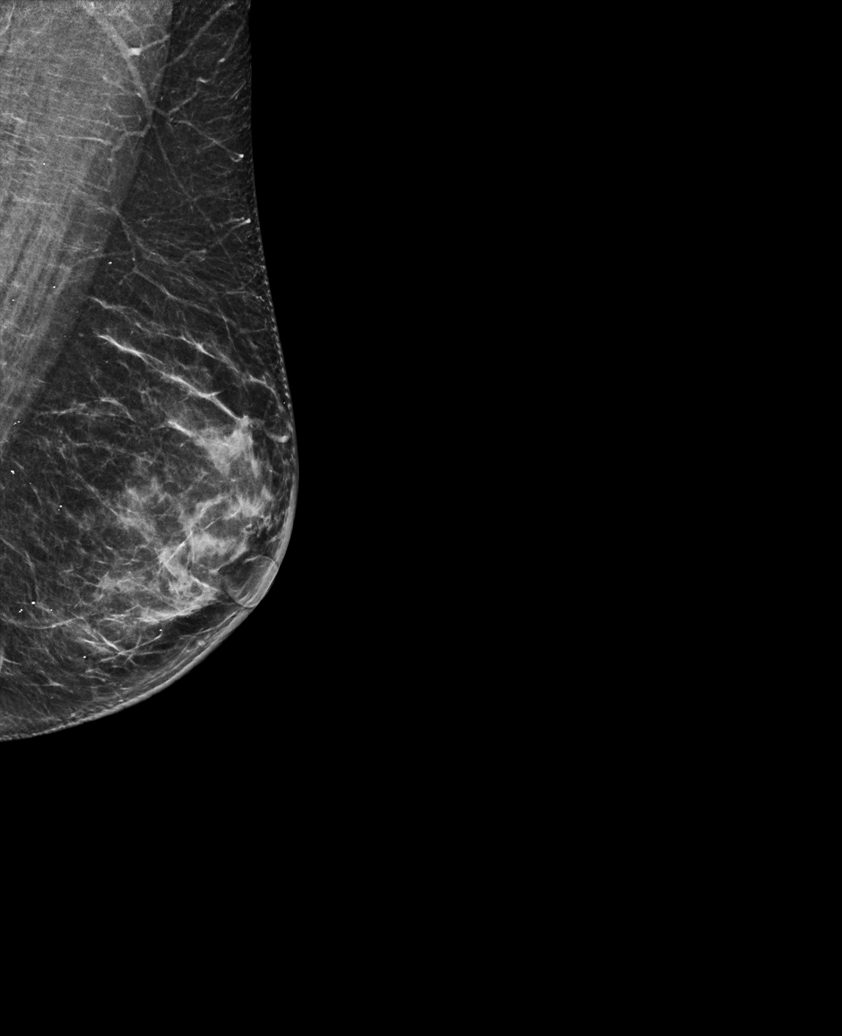

[L MLO tomo · tomo slice 31/60.0]
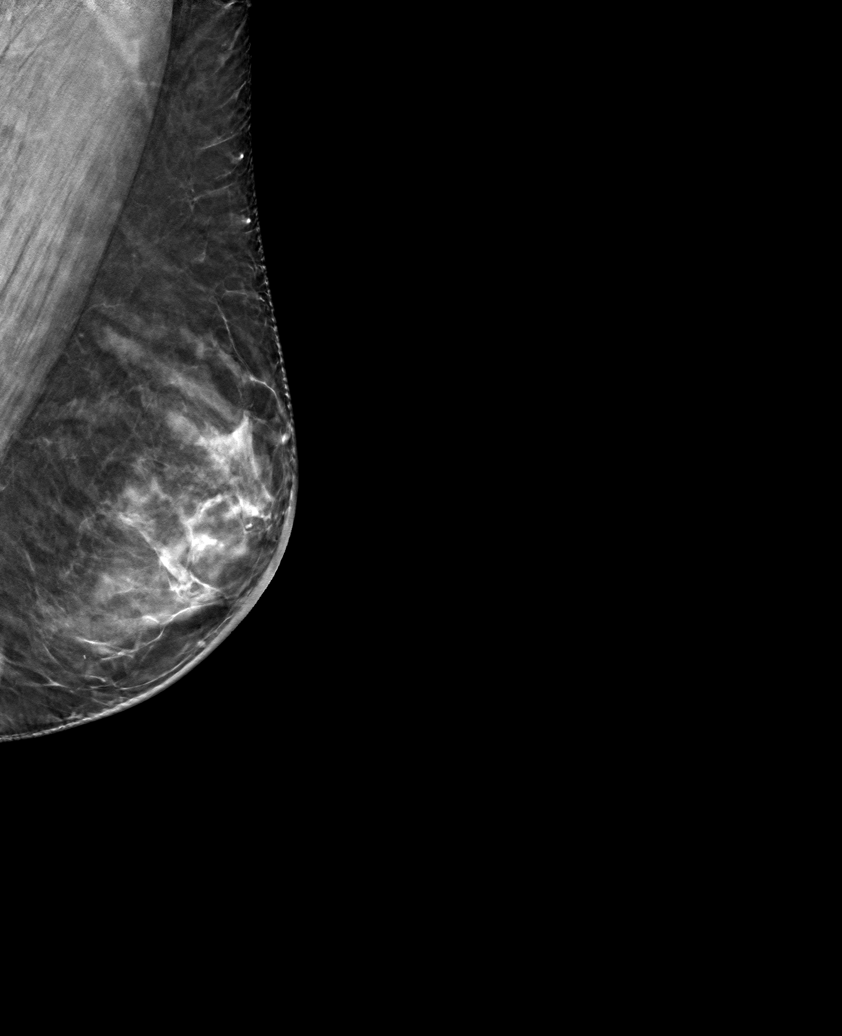

[6 of 30 positions shown; findings below may reference images not displayed]

ACR Breast Density Category b: There are scattered areas of
fibroglandular density.
FINDINGS: There are no findings suspicious for malignancy. Images were
processed with CAD.
IMPRESSION: No mammographic evidence of malignancy. A result letter of this
screening mammogram will be mailed directly to the patient.

RECOMMENDATION:
Screening mammogram in one year. (Code:[id number])

BI-RADS CATEGORY  1: Negative.

## 2021-06-01 NOTE — Progress Notes (Signed)
    Subjective:    Patient ID: Olivia Atkinson, female   DOB: 09-Dec-1975, 45 y.o.   MRN: [id number]   HPI  Describes dizziness as the room is spinning which leads to nausea when first gets up in the morning for the past 3 weeks.   Also notes "everything spinning" when she lies down to sleep with only one episode of associated nausea. Has had this intermittently for 4-5 years.   First episode occurred in 2016.  She states seemed to be associated with a muscle tension HA.   No associated focal neurologic symptoms:  no weakness, numbness or tingling.   She has not recently had any URI symptoms.   She may have a bit more stress recently, but cannot say what may be the cause.  LMP 10/12/2020.  She is perimenopausal so not regular and can have a period every 1-2 months.  No use of any sort of birth control, though she does not believe she is pregnant.  Current Meds  Medication Sig   cetirizine (ZYRTEC) 10 MG tablet Take 10 mg by mouth daily.   dicyclomine (BENTYL) 20 MG tablet 1 tab every 6 hours as needed before meals for abdominal discomfort   Melatonin 10 MG TABS Take 10 mg by mouth at bedtime.   psyllium (METAMUCIL) 58.6 % packet Take 1 packet by mouth daily.   No Known Allergies   Review of Systems    Objective:   BP 118/70 (BP Location: Left Arm, Patient Position: Sitting, Cuff Size: Normal)   Pulse 76   Resp 16   Ht 5' 1.25" (1.556 m)   Wt 148 lb (67.1 kg)   BMI 27.74 kg/m   Physical Exam NAD HEENT:  PERRL, EOMI, TMs pearly gray, throat without injection.   Neck:  Supple, No adenopathy, not thyromegaly Chest:  CTA CV:  RRR without murmur or rub  Radial and DP pulses normal and equal Abd:  S, NT, No HSM or mass, + BS Neuro:  A & O x 3, CN II-XII grossly intact.  DTRs 2+/4, Motor 5/5, Sensory grossly normal to light touch.  Rapid alternating motions, finger to nose to finger and romberg all normal or negative.  No symptoms or findings with Lucious Groves  maneuver.   Assessment & Plan    Vertigo:  no concerning findings today.  Discussed performing Lucious Groves when episode of vertigo starts up.  Neck exercises if muscle contraction HA seem to trigger.  Dramamine for the dizziness, though minimal benefit.

## 2021-08-26 ENCOUNTER — Ambulatory Visit: Payer: Self-pay | Admitting: Internal Medicine

## 2021-08-26 ENCOUNTER — Encounter: Payer: Self-pay | Admitting: Internal Medicine

## 2021-08-26 ENCOUNTER — Other Ambulatory Visit: Payer: Self-pay

## 2021-08-26 VITALS — BP 92/66 | HR 68 | Resp 12 | Ht 62.0 in | Wt 147.5 lb

## 2021-08-26 DIAGNOSIS — E782 Mixed hyperlipidemia: Secondary | ICD-10-CM

## 2021-08-26 DIAGNOSIS — M7712 Lateral epicondylitis, left elbow: Secondary | ICD-10-CM

## 2021-08-26 DIAGNOSIS — M5432 Sciatica, left side: Secondary | ICD-10-CM

## 2021-08-26 DIAGNOSIS — Z0000 Encounter for general adult medical examination without abnormal findings: Secondary | ICD-10-CM

## 2021-08-26 DIAGNOSIS — M5431 Sciatica, right side: Secondary | ICD-10-CM

## 2021-08-26 DIAGNOSIS — R7303 Prediabetes: Secondary | ICD-10-CM

## 2021-08-26 DIAGNOSIS — M7711 Lateral epicondylitis, right elbow: Secondary | ICD-10-CM

## 2021-08-26 DIAGNOSIS — Z1159 Encounter for screening for other viral diseases: Secondary | ICD-10-CM

## 2021-08-26 NOTE — Progress Notes (Unsigned)
Subjective:    Patient ID: Olivia Atkinson, female   DOB: 1976/03/19, 45 y.o.   MRN: [id number]   HPI  CPE without pap  1.  Pap:  Last pap 08/2020 and fine.    2.  Mammogram:  Last 11/2020 and normal.  No family history of breast cancer.    3.  Osteoprevention:  She is drinking milk 3-4 servings daily.  Walks most days.   4.  Guaiac Cards/FIT:  Last performed 08/2020 and negative.    5.  Colonoscopy:  Never.  No family history of colon cancer.    6.  Immunizations:  Has not had a [employer] COVID booster.   Immunization History  Administered Date(s) Administered   Influenza,inj,Quad PF,6+ Mos 09/23/2016   Influenza,inj,quad, With Preservative 09/12/2019   Influenza-Unspecified 09/11/2020, 08/23/2021   PFIZER(Purple Top)SARS-COV-2 Vaccination 05/22/2020, 06/15/2020   Td 09/12/2019   Tdap 08/08/2009    7.  Glucose/Cholesterol :  History of prediabetes with A1C of 5.8% 10/21 and hypercholesterolemia.   Lipid Panel     Component Value Date/Time   CHOL 218 (H) 09/11/2020 0912   TRIG 123 09/11/2020 0912   HDL 50 09/11/2020 0912   CHOLHDL 4.8 Ratio 06/18/2008 2106   VLDL 31 06/18/2008 2106   LDLCALC 146 (H) 09/11/2020 0912   LABVLDL 22 09/11/2020 0912    Current Meds  Medication Sig   cetirizine (ZYRTEC) 10 MG tablet Take 10 mg by mouth daily.   dicyclomine (BENTYL) 20 MG tablet 1 tab every 6 hours as needed before meals for abdominal discomfort   Melatonin 10 MG TABS Take 10 mg by mouth at bedtime.   psyllium (METAMUCIL) 58.6 % packet Take 1 packet by mouth daily.   Past Medical History:  Diagnosis Date   Hypercholesteremia    IBS (irritable bowel syndrome)    Prediabetes 2011   History reviewed. No pertinent surgical history. 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  Tobacco Use   Smoking status: Never   Smokeless tobacco: Never  Vaping Use   Vaping Use: Never used  Substance and Sexual Activity   Alcohol use: No   Drug use: No   Sexual activity: Yes    Birth control/protection: None  Other Topics Concern   Not on file  Social History Narrative   Lives at home with long term boyfriend as well as their 3 children   And a dog, Designer, fashion/clothing   Social Determinants of Corporate investment banker Strain: Low Risk    Difficulty of Paying Living Expenses: Not hard at all  Food Insecurity: No Food Insecurity   Worried About Programme researcher, broadcasting/film/video in the Last Year: Never true   Barista in the Last Year: Never true  Transportation Needs: No Transportation Needs   Lack of Transportation (Medical):  No   Lack of Transportation (Non-Medical): No  Physical Activity: Not on file  Stress: Not on file  Social Connections: Not on file  Intimate Partner Violence: Not At Risk   Fear of Current or Ex-Partner: No   Emotionally Abused: No   Physically Abused: No   Sexually Abused: No     No Known Allergies   Review of Systems  HENT:  Negative for dental problem (Cancelled appt earlier in year as orange card required renewal.  She will take care of rescheduling.).   Musculoskeletal:        Bilateral elbows, L>>R,  bothering her for some time.  History of years ago with similar pain in left elbow.  Reportedly left elbow injected in past.  Sounds like at [facility].  Mopping hurts the most.  Hurts to squeeze out mops.  Hurts to carry bags from grocery store.   Also pain just below fold of buttocks  L>>R.  Hurts worse after  sitting for a time.  Takes a while for her to walk smoothly.   No redness or swelling about areas of pain.   Ibuprofen 600 mg helps.   Does not sit on hard chairs.      Objective:   BP 92/66 (BP Location: Left Arm, Patient Position: Sitting, Cuff Size: Normal)   Pulse 68   Resp 12   Ht 5\' 2"  (1.575 m)   Wt 147 lb 8 oz (66.9 kg)   LMP 06/21/2021 (Within Months)   BMI 26.98 kg/m   Physical Exam HENT:     Head: Normocephalic and atraumatic.     Right Ear: Tympanic membrane, ear canal and external ear normal.     Left Ear: Tympanic membrane, ear canal and external ear normal.     Nose: Nose normal.     Mouth/Throat:     Mouth: Mucous membranes are moist.     Pharynx: Oropharynx is clear.  Eyes:     Extraocular Movements: Extraocular movements intact.     Conjunctiva/sclera: Conjunctivae normal.     Pupils: Pupils are equal, round, and reactive to light.     Comments: Discs sharp bilaterally.  Neck:     Thyroid: No thyroid mass or thyromegaly.  Cardiovascular:     Rate and Rhythm: Normal rate and regular rhythm.     Heart sounds: S1 normal and S2 normal. No murmur heard.    No S3 or S4 sounds.     Comments: No carotid bruits.  Carotid, radial, femoral, DP and PT pulses normal and equal.    Pulmonary:     Effort: Pulmonary effort is normal.     Breath sounds: Normal breath sounds.  Chest:  Breasts:    Right: No inverted nipple, mass or nipple discharge.     Left: No inverted nipple, mass or nipple discharge.  Abdominal:     General: Bowel sounds are normal.     Palpations: Abdomen is soft. There is no hepatomegaly, splenomegaly or mass.     Tenderness: There is no abdominal tenderness.     Hernia: No hernia is present.  Genitourinary:    Comments: Normal external female genitalia. No uterine or adnexal mass or tenderness.  Musculoskeletal:        General: Normal range of motion.     Cervical back: Normal range of motion and neck supple.     Right lower leg: No  edema.     Left lower leg: No edema.     Comments: Tender over  proximal radial forearm musculature and insertion at lateral epicondyle bilaterally NT over L/S spinous processes and paraspinous musculature.    Lymphadenopathy:     Head:     Right side of head: No submental or submandibular adenopathy.     Left side of head: No submental or submandibular adenopathy.     Cervical: No cervical adenopathy.     Upper Body:     Right upper body: No supraclavicular or axillary adenopathy.     Left upper body: No supraclavicular or axillary adenopathy.     Lower Body: No right inguinal adenopathy. No left inguinal adenopathy.  Skin:    General: Skin is warm.     Capillary Refill: Capillary refill takes less than 2 seconds.     Findings: No rash.  Neurological:     General: No focal deficit present.     Mental Status: She is alert and oriented to person, place, and time.     Cranial Nerves: Cranial nerves 2-12 are intact.     Sensory: Sensation is intact.     Motor: Motor function is intact.     Coordination: Coordination is intact.     Gait: Gait is intact.     Deep Tendon Reflexes: Reflexes are normal and symmetric.  Psychiatric:        Behavior: Behavior normal. Behavior is cooperative.      Assessment & Plan    CPE without pap Call if does not get letter to schedule mammogram for January.   FIT testing to return in 2 weeks. Hep C screening.  CBC   2.  Prediabetes:  CMP, A1C  3.  Hypercholesterolemia:  FLP.  4.  Bilateral sciatica:  PT referral.  5.  Bilateral lateral epicondylitis/elbows:  velcro arm straps.  Ibuprofen.  Referral to [facility] pro bono PT

## 2021-08-26 NOTE — Patient Instructions (Signed)
Ibuprofen 600 mg dos veces al dia con comida para 14 dias

## 2021-08-27 LAB — HGB A1C W/O EAG: Hgb A1c MFr Bld: 6 % — ABNORMAL HIGH (ref 4.8–5.6)

## 2021-08-27 LAB — COMPREHENSIVE METABOLIC PANEL
ALT: 24 IU/L (ref 0–32)
AST: 23 IU/L (ref 0–40)
Albumin/Globulin Ratio: 1.5 (ref 1.2–2.2)
Albumin: 4.5 g/dL (ref 3.8–4.8)
Alkaline Phosphatase: 62 IU/L (ref 44–121)
BUN/Creatinine Ratio: 16 (ref 9–23)
BUN: 10 mg/dL (ref 6–24)
Bilirubin Total: 0.3 mg/dL (ref 0.0–1.2)
CO2: 23 mmol/L (ref 20–29)
Calcium: 9.6 mg/dL (ref 8.7–10.2)
Chloride: 102 mmol/L (ref 96–106)
Creatinine, Ser: 0.63 mg/dL (ref 0.57–1.00)
Globulin, Total: 3 g/dL (ref 1.5–4.5)
Glucose: 91 mg/dL (ref 70–99)
Potassium: 4 mmol/L (ref 3.5–5.2)
Sodium: 141 mmol/L (ref 134–144)
Total Protein: 7.5 g/dL (ref 6.0–8.5)
eGFR: 111 mL/min/{1.73_m2} (ref >59)

## 2021-08-27 LAB — CBC WITH DIFFERENTIAL/PLATELET
Basophils Absolute: 0.1 10*3/uL (ref 0.0–0.2)
Basos: 1 %
EOS (ABSOLUTE): 0.1 10*3/uL (ref 0.0–0.4)
Eos: 2 %
Hematocrit: 36.7 % (ref 34.0–46.6)
Hemoglobin: 12.2 g/dL (ref 11.1–15.9)
Immature Grans (Abs): 0 10*3/uL (ref 0.0–0.1)
Immature Granulocytes: 0 %
Lymphocytes Absolute: 1.8 10*3/uL (ref 0.7–3.1)
Lymphs: 37 %
MCH: 29 pg (ref 26.6–33.0)
MCHC: 33.2 g/dL (ref 31.5–35.7)
MCV: 87 fL (ref 79–97)
Monocytes Absolute: 0.4 10*3/uL (ref 0.1–0.9)
Monocytes: 8 %
Neutrophils Absolute: 2.6 10*3/uL (ref 1.4–7.0)
Neutrophils: 52 %
Platelets: 266 10*3/uL (ref 150–450)
RBC: 4.2 x10E6/uL (ref 3.77–5.28)
RDW: 12.4 % (ref 11.7–15.4)
WBC: 5 10*3/uL (ref 3.4–10.8)

## 2021-08-27 LAB — HEPATITIS C ANTIBODY: Hep C Virus Ab: <0.1 s/co ratio (ref 0.0–0.9)

## 2021-08-27 LAB — LIPID PANEL W/O CHOL/HDL RATIO
Cholesterol, Total: 243 mg/dL — ABNORMAL HIGH (ref 100–199)
HDL: 57 mg/dL (ref >39)
LDL Chol Calc (NIH): 164 mg/dL — ABNORMAL HIGH (ref 0–99)
Triglycerides: 123 mg/dL (ref 0–149)
VLDL Cholesterol Cal: 22 mg/dL (ref 5–40)

## 2021-09-02 ENCOUNTER — Other Ambulatory Visit: Payer: Self-pay

## 2021-09-02 DIAGNOSIS — Z1211 Encounter for screening for malignant neoplasm of colon: Secondary | ICD-10-CM

## 2021-09-02 LAB — POC FIT TEST STOOL: Fecal Occult Blood: NEGATIVE

## 2021-10-29 ENCOUNTER — Encounter: Payer: Self-pay | Admitting: Physician Assistant

## 2021-10-29 ENCOUNTER — Ambulatory Visit
Admission: EM | Admit: 2021-10-29 | Discharge: 2021-10-29 | Disposition: A | Discharge status: Home / Self Care | Payer: No Typology Code available for payment source | Attending: Physician Assistant | Admitting: Physician Assistant

## 2021-10-29 ENCOUNTER — Other Ambulatory Visit: Payer: Self-pay

## 2021-10-29 DIAGNOSIS — K219 Gastro-esophageal reflux disease without esophagitis: Secondary | ICD-10-CM

## 2021-10-29 MED ORDER — PANTOPRAZOLE SODIUM 20 MG PO TBEC: 20.0000 mg | DELAYED_RELEASE_TABLET | Freq: Every day | ORAL | 0 refills | Status: DC

## 2021-10-29 MED ORDER — DIPHENHYDRAMINE HCL 12.5 MG/5ML PO LIQD: 5.0000 mL | Freq: Three times a day (TID) | ORAL | 0 refills | Status: DC | PRN

## 2021-10-29 NOTE — ED Provider Notes (Signed)
[facility]    CSN: [id number] Arrival date & time: 10/29/21  0813      History   Chief Complaint Chief Complaint  Patient presents with   Sore Throat    HPI Olivia Atkinson is a 45 y.o. female.   Patient here today for evaluation of throat discomfort and sensation of swelling.  She reports that she is also had some pressure in her ears as well as some nasal congestion.  Symptoms have been present for 10 days.  She states that she did eat spicy chili for breakfast 1 morning which did seem to worsen symptoms.  She describes throat irritation as burning.  She denies any nausea, vomiting or diarrhea.  She has not had any abdominal pain.  She has not had any fever or chills.  She took 1 dose of omeprazole last night with minimal relief.  She has not taken any other medication for treatment.  The history is provided by the patient. The history is limited by a language barrier. A language interpreter was used Medical laboratory scientific officer- [place]).   Past Medical History:  Diagnosis Date   Hypercholesteremia    IBS (irritable bowel syndrome)    Prediabetes 2011    Patient Active Problem List   Diagnosis Date Noted   Lateral epicondylitis of both elbows 01/27/2021   Bilateral medial epicondylitis of elbow joint 01/27/2021   IBS (irritable bowel syndrome)    Trochanteric bursitis of left hip 12/27/2019   Acute left-sided low back pain without sciatica 12/27/2019   Primary insomnia 09/23/2019   Perimenopausal vasomotor symptoms 09/12/2019   Tinea pedis of left foot 09/12/2019   Onychomycosis of toenail 09/12/2019   Sleep disorder 09/12/2019   Mixed hyperlipidemia 09/12/2019   Irritable bowel syndrome with both constipation and diarrhea 09/12/2019   Generalized anxiety disorder 07/30/2019   Prediabetes     History reviewed. No pertinent surgical history.  OB History     Gravida  3   Para  0   Term  0   Preterm  0   AB  0   Living         SAB  0   IAB  0    Ectopic  0   Multiple      Live Births               Home Medications    Prior to Admission medications   Medication Sig Start Date End Date Taking? Authorizing Provider  magic mouthwash (lidocaine, diphenhydrAMINE, alum & mag hydroxide) suspension Swish and swallow 5 mLs 3 (three) times daily as needed for mouth pain. 10/29/21  Yes Francene Finders, PA-C  pantoprazole (PROTONIX) 20 MG tablet Take 1 tablet (20 mg total) by mouth daily. 10/29/21  Yes Francene Finders, PA-C  cetirizine (ZYRTEC) 10 MG tablet Take 10 mg by mouth daily.    [provider]  dicyclomine (BENTYL) 20 MG tablet 1 tab every 6 hours as needed before meals for abdominal discomfort 08/26/20   Mack Hook, MD  ibuprofen (ADVIL) 200 MG tablet 3 tabs with food twice daily for 2 weeks. Patient not taking: Reported on 08/26/2021 01/27/21   Mack Hook, MD  Melatonin 10 MG TABS Take 10 mg by mouth at bedtime.    [provider]  meloxicam (MOBIC) 15 MG tablet Take 1 tablet (15 mg total) by mouth daily. Patient not taking: Reported on 08/26/2021 12/17/20   Raylene Everts, MD  psyllium (METAMUCIL) 58.6 % packet Take 1  packet by mouth daily.    [provider]  Tea Tree 100 % OIL mezcla con Gold Bond Foot cream y aplicar a sus unas una vez al dia Patient not taking: Reported on 08/26/2021 12/05/19   Julieanne Manson, MD  tiZANidine (ZANAFLEX) 4 MG tablet Take 1-2 tablets (4-8 mg total) by mouth every 6 (six) hours as needed for muscle spasms. Patient not taking: Reported on 08/26/2021 12/17/20   Eustace Moore, MD    Family History Family History  Problem Relation Age of Onset   Diabetes Mother    Hypertension Mother    Hyperlipidemia Mother    Hypertension Father    Cancer Maternal Grandfather        skin cancer   Diabetes Maternal Grandfather    Cancer Paternal Grandmother        Throat   Heart disease Paternal Grandfather    Breast cancer Neg Hx     Social  History Social History   Tobacco Use   Smoking status: Never   Smokeless tobacco: Never  Vaping Use   Vaping Use: Never used  Substance Use Topics   Alcohol use: No   Drug use: No     Allergies   Patient has no known allergies.   Review of Systems Review of Systems  Constitutional:  Negative for chills and fever.  HENT:  Positive for congestion, sinus pressure and sore throat.   Eyes:  Negative for discharge and redness.  Respiratory:  Negative for shortness of breath.   Gastrointestinal:  Negative for abdominal pain, diarrhea, nausea and vomiting.    Physical Exam Triage Vital Signs ED Triage Vitals  Enc Vitals Group     BP      Pulse      Resp      Temp      Temp src      SpO2      Weight      Height      Head Circumference      Peak Flow      Pain Score      Pain Loc      Pain Edu?      Excl. in GC?    No data found.  Updated Vital Signs BP 114/72 (BP Location: Left Arm)   Pulse 82   Temp 98.8 F (37.1 C) (Oral)   Resp 18   SpO2 98%      Physical Exam Vitals and nursing note reviewed.  Constitutional:      General: She is not in acute distress.    Appearance: Normal appearance. She is not ill-appearing.  HENT:     Head: Normocephalic and atraumatic.     Right Ear: Tympanic membrane normal.     Left Ear: Tympanic membrane normal.     Nose: Nose normal. No congestion or rhinorrhea.     Mouth/Throat:     Mouth: Mucous membranes are moist.     Pharynx: Posterior oropharyngeal erythema present. No oropharyngeal exudate.  Eyes:     Conjunctiva/sclera: Conjunctivae normal.  Cardiovascular:     Rate and Rhythm: Normal rate and regular rhythm.     Heart sounds: Normal heart sounds. No murmur heard. Pulmonary:     Effort: Pulmonary effort is normal. No respiratory distress.     Breath sounds: Normal breath sounds. No wheezing, rhonchi or rales.  Skin:    General: Skin is warm and dry.  Neurological:     Mental Status: She is alert.  Psychiatric:        Mood and Affect: Mood normal.        Thought Content: Thought content normal.     UC Treatments / Results  Labs (all labs ordered are listed, but only abnormal results are displayed) Labs Reviewed - No data to display  EKG   Radiology No results found.  Procedures Procedures (including critical care time)  Medications Ordered in UC Medications - No data to display  Initial Impression / Assessment and Plan / UC Course  I have reviewed the triage vital signs and the nursing notes.  Pertinent labs & imaging results that were available during my care of the patient were reviewed by me and considered in my medical decision making (see chart for details).  I suspect symptoms are related to acid reflux and will treat with pantoprazole.  Magic mouthwash prescribed given throat and oral irritation.  Advised she avoid spicy foods or highly acidic foods and recommended she not lie down within 3 hours of last meal.  Recommend follow-up if no improvement in 2 weeks or sooner with any worsening.  Final Clinical Impressions(s) / UC Diagnoses   Final diagnoses:  Gastroesophageal reflux disease, unspecified whether esophagitis present   Discharge Instructions   None    ED Prescriptions     Medication Sig Dispense Auth. Provider   pantoprazole (PROTONIX) 20 MG tablet Take 1 tablet (20 mg total) by mouth daily. 30 tablet Ewell Poe F, PA-C   magic mouthwash (lidocaine, diphenhydrAMINE, alum & mag hydroxide) suspension Swish and swallow 5 mLs 3 (three) times daily as needed for mouth pain. 360 mL Francene Finders, PA-C      PDMP not reviewed this encounter.   Francene Finders, PA-C 10/29/21 1023

## 2021-10-29 NOTE — ED Triage Notes (Signed)
Pt c/o "when I swallow my throat is closing," something "spicy hot is coming up," belches often, occurs after eating, constipation (chronic), headache, right ear ache, sinus pressure.   Denies nausea, vomiting, diarrhea,   Onset about 10 days ago.

## 2021-11-08 ENCOUNTER — Telehealth: Payer: Self-pay

## 2021-11-08 NOTE — Telephone Encounter (Signed)
Patient called to be seen for throat discomfort. Has the sensation that food is stuck in her throat. Pt describes that her throat appears to be irritated. Visited urgent care 10/29/21 and was given omeprazole for 11 days. The 11 days ends today 11/08/21 and pt has had no improvements in her symptom.

## 2021-11-09 ENCOUNTER — Ambulatory Visit
Admission: EM | Admit: 2021-11-09 | Discharge: 2021-11-09 | Disposition: A | Discharge status: Home / Self Care | Payer: Self-pay | Attending: Physician Assistant | Admitting: Physician Assistant

## 2021-11-09 ENCOUNTER — Other Ambulatory Visit: Payer: Self-pay

## 2021-11-09 ENCOUNTER — Encounter: Payer: Self-pay | Admitting: Emergency Medicine

## 2021-11-09 DIAGNOSIS — J069 Acute upper respiratory infection, unspecified: Secondary | ICD-10-CM

## 2021-11-09 NOTE — ED Provider Notes (Signed)
[facility]    CSN: [id number] Arrival date & time: 11/09/21  1705      History   Chief Complaint Chief Complaint  Patient presents with   Cough    HPI Olivia Atkinson is a 45 y.o. female.   Patient here today for evaluation of stuffy nose, headache, and cough she has had for the last 3 days.  She reports that symptoms improved and then worsened.  She has had some sore throat as well.  She has tried gingerbread and chamomile without significant improvement.  The history is provided by the patient. The history is limited by a language barrier. A language interpreter was used Banker- eduardo, carla).  Cough Associated symptoms: sore throat   Associated symptoms: no chills, no ear pain, no eye discharge, no fever, no shortness of breath and no wheezing    Past Medical History:  Diagnosis Date   Hypercholesteremia    IBS (irritable bowel syndrome)    Prediabetes 2011    Patient Active Problem List   Diagnosis Date Noted   Lateral epicondylitis of both elbows 01/27/2021   Bilateral medial epicondylitis of elbow joint 01/27/2021   IBS (irritable bowel syndrome)    Trochanteric bursitis of left hip 12/27/2019   Acute left-sided low back pain without sciatica 12/27/2019   Primary insomnia 09/23/2019   Perimenopausal vasomotor symptoms 09/12/2019   Tinea pedis of left foot 09/12/2019   Onychomycosis of toenail 09/12/2019   Sleep disorder 09/12/2019   Mixed hyperlipidemia 09/12/2019   Irritable bowel syndrome with both constipation and diarrhea 09/12/2019   Generalized anxiety disorder 07/30/2019   Prediabetes     History reviewed. No pertinent surgical history.  OB History     Gravida  3   Para  0   Term  0   Preterm  0   AB  0   Living         SAB  0   IAB  0   Ectopic  0   Multiple      Live Births               Home Medications    Prior to Admission medications   Medication Sig Start Date End Date Taking?  Authorizing Provider  cetirizine (ZYRTEC) 10 MG tablet Take 10 mg by mouth daily.    [provider]  dicyclomine (BENTYL) 20 MG tablet 1 tab every 6 hours as needed before meals for abdominal discomfort 08/26/20   Julieanne Manson, MD  ibuprofen (ADVIL) 200 MG tablet 3 tabs with food twice daily for 2 weeks. Patient not taking: Reported on 08/26/2021 01/27/21   Julieanne Manson, MD  magic mouthwash (lidocaine, diphenhydrAMINE, alum & mag hydroxide) suspension Swish and swallow 5 mLs 3 (three) times daily as needed for mouth pain. 10/29/21   Tomi Bamberger, PA-C  Melatonin 10 MG TABS Take 10 mg by mouth at bedtime.    [provider]  meloxicam (MOBIC) 15 MG tablet Take 1 tablet (15 mg total) by mouth daily. Patient not taking: Reported on 08/26/2021 12/17/20   Eustace Moore, MD  pantoprazole (PROTONIX) 20 MG tablet Take 1 tablet (20 mg total) by mouth daily. 10/29/21   Tomi Bamberger, PA-C  psyllium (METAMUCIL) 58.6 % packet Take 1 packet by mouth daily.    [provider]  Tea Tree 100 % OIL mezcla con [employer] Foot cream y aplicar a sus unas una vez al dia Patient not taking: Reported on 08/26/2021  12/05/19   Julieanne Manson, MD  tiZANidine (ZANAFLEX) 4 MG tablet Take 1-2 tablets (4-8 mg total) by mouth every 6 (six) hours as needed for muscle spasms. Patient not taking: Reported on 08/26/2021 12/17/20   Eustace Moore, MD    Family History Family History  Problem Relation Age of Onset   Diabetes Mother    Hypertension Mother    Hyperlipidemia Mother    Hypertension Father    Cancer Maternal Grandfather        skin cancer   Diabetes Maternal Grandfather    Cancer Paternal Grandmother        Throat   Heart disease Paternal Grandfather    Breast cancer Neg Hx     Social History Social History   Tobacco Use   Smoking status: Never   Smokeless tobacco: Never  Vaping Use   Vaping Use: Never used  Substance Use Topics   Alcohol use: No    Drug use: No     Allergies   Patient has no known allergies.   Review of Systems Review of Systems  Constitutional:  Negative for chills and fever.  HENT:  Positive for congestion and sore throat. Negative for ear pain.   Eyes:  Negative for discharge and redness.  Respiratory:  Positive for cough. Negative for shortness of breath and wheezing.   Gastrointestinal:  Negative for abdominal pain, diarrhea, nausea and vomiting.    Physical Exam Triage Vital Signs ED Triage Vitals  Enc Vitals Group     BP 11/09/21 1715 126/77     Pulse Rate 11/09/21 1715 97     Resp 11/09/21 1715 16     Temp 11/09/21 1715 98.4 F (36.9 C)     Temp Source 11/09/21 1715 Oral     SpO2 11/09/21 1715 96 %     Weight --      Height --      Head Circumference --      Peak Flow --      Pain Score 11/09/21 1716 0     Pain Loc --      Pain Edu? --      Excl. in GC? --    No data found.  Updated Vital Signs BP 126/77 (BP Location: Left Arm)    Pulse 97    Temp 98.4 F (36.9 C) (Oral)    Resp 16    SpO2 96%      Physical Exam Vitals and nursing note reviewed.  Constitutional:      General: She is not in acute distress.    Appearance: Normal appearance. She is not ill-appearing.  HENT:     Head: Normocephalic and atraumatic.     Right Ear: Tympanic membrane normal.     Left Ear: Tympanic membrane normal.     Nose: Congestion present.     Mouth/Throat:     Mouth: Mucous membranes are moist.     Pharynx: No oropharyngeal exudate or posterior oropharyngeal erythema.  Eyes:     Conjunctiva/sclera: Conjunctivae normal.  Cardiovascular:     Rate and Rhythm: Normal rate and regular rhythm.     Heart sounds: Normal heart sounds. No murmur heard. Pulmonary:     Effort: Pulmonary effort is normal. No respiratory distress.     Breath sounds: Normal breath sounds. No wheezing, rhonchi or rales.  Skin:    General: Skin is warm and dry.  Neurological:     Mental Status: She is alert.   Psychiatric:  Mood and Affect: Mood normal.        Thought Content: Thought content normal.     UC Treatments / Results  Labs (all labs ordered are listed, but only abnormal results are displayed) Labs Reviewed  COVID-19, FLU A+B NAA    EKG   Radiology No results found.  Procedures Procedures (including critical care time)  Medications Ordered in UC Medications - No data to display  Initial Impression / Assessment and Plan / UC Course  I have reviewed the triage vital signs and the nursing notes.  Pertinent labs & imaging results that were available during my care of the patient were reviewed by me and considered in my medical decision making (see chart for details).    Suspect viral etiology of symptoms.  Will order COVID and flu screening.  Recommend symptomatic treatment otherwise.  Encouraged follow-up with any further concerns.  Final Clinical Impressions(s) / UC Diagnoses   Final diagnoses:  Acute upper respiratory infection   Discharge Instructions   None    ED Prescriptions   None    PDMP not reviewed this encounter.   Francene Finders, PA-C 11/09/21 [phone]

## 2021-11-09 NOTE — ED Triage Notes (Addendum)
Cough, stuffy nose, headache x 3 days. Got better, then got bad again. Tried gingerbread and chamomile without improvement

## 2021-11-10 LAB — COVID-19, FLU A+B NAA
Influenza A, NAA: NOT DETECTED
Influenza B, NAA: NOT DETECTED
SARS-CoV-2, NAA: NOT DETECTED

## 2021-11-21 DIAGNOSIS — M7711 Lateral epicondylitis, right elbow: Secondary | ICD-10-CM

## 2021-11-21 HISTORY — DX: Lateral epicondylitis, right elbow: M77.11

## 2021-11-30 ENCOUNTER — Other Ambulatory Visit: Payer: Self-pay

## 2021-11-30 ENCOUNTER — Encounter: Payer: Self-pay | Admitting: Internal Medicine

## 2021-11-30 ENCOUNTER — Ambulatory Visit: Payer: Self-pay | Admitting: Internal Medicine

## 2021-11-30 VITALS — BP 112/62 | HR 88 | Resp 16 | Ht 62.0 in | Wt 145.5 lb

## 2021-11-30 DIAGNOSIS — K581 Irritable bowel syndrome with constipation: Secondary | ICD-10-CM

## 2021-11-30 NOTE — Progress Notes (Signed)
    Subjective:    Patient ID: Olivia Atkinson, female   DOB: September 24, 1976, 46 y.o.   MRN: [id number]   HPI  For 2 weeks, felt like something stuck in throat, like had to belch and let air out.  Does not bother her when eating or swallowing saliva.  States it starts up about 20-30 minutes after first meal of day, then with her the rest of day.   Also, would feel full after breakfast and not get hungry until 10:30 p.m. and would eat before going to bed.   Ultimately, went to Urgent care on 10/29/2021 and diagnosed with GERD, started on Pantoprazole. She did not feel the medication helped much with her throat symptoms, but did help with improving appetite during day, so she was able to eat. Took the medication for about 19 days and then stopped as she did not note a big change.   Was taking the medication after breakfast.   She continues to have the sense of something stuck in throat.  Denies dysphagia.  Does not have the symptoms when first arises in morning.  Occurs as above after first meal and then with her the rest of the day Currently eating 3 meals daily.  She is a grazer.   No melena or hematochezia.  No nausea or vomiting.  No diarrhea.  Chronically with constipation.    States when takes metamucil, constipation is much better, but doesn't want to have to take a medication and stops when feels better.    Finally, admits was drinking an avocado pit tea for 22 days prior to symptoms starting and she thinks this had something to do with her symptoms.  Current Meds  Medication Sig   cetirizine (ZYRTEC) 10 MG tablet Take 10 mg by mouth daily.   Melatonin 10 MG TABS Take 10 mg by mouth at bedtime.   psyllium (METAMUCIL) 58.6 % packet Take 1 packet by mouth daily.   No Known Allergies   Review of Systems    Objective:   BP 112/62 (BP Location: Right Arm, Patient Position: Sitting, Cuff Size: Normal)   Pulse 88   Resp 16   Ht 5\' 2"  (1.575 m)   Wt 145 lb 8 oz (66 kg)   LMP  09/29/2021 (Within Weeks)   BMI 26.61 kg/m   Physical Exam NAD HEENT:  PERRL, EOMI, throat without injection. Neck:  Supple, No adenopathy Chest:  CTA CV:  RRR  Abd:  S, NT, No HSM.  Palpable hard stool in LLQ  Assessment & Plan    GERD/IBS constipation dominant:  to restart Pantoprazole and take on empty stomach daily for 11 days left, Elevate HOB, no lying down for 2 hours after eating.  To restart Metamucil and use daily.  Increase water intake to 4 twenty oz bottles daily.

## 2021-12-01 NOTE — Telephone Encounter (Signed)
Seen by Dr Mulberry 

## 2021-12-27 ENCOUNTER — Other Ambulatory Visit: Payer: Self-pay

## 2021-12-27 DIAGNOSIS — R7303 Prediabetes: Secondary | ICD-10-CM

## 2021-12-27 DIAGNOSIS — E782 Mixed hyperlipidemia: Secondary | ICD-10-CM

## 2021-12-28 LAB — LIPID PANEL W/O CHOL/HDL RATIO
Cholesterol, Total: 204 mg/dL — ABNORMAL HIGH (ref 100–199)
HDL: 53 mg/dL (ref >39)
LDL Chol Calc (NIH): 131 mg/dL — ABNORMAL HIGH (ref 0–99)
Triglycerides: 110 mg/dL (ref 0–149)
VLDL Cholesterol Cal: 20 mg/dL (ref 5–40)

## 2021-12-28 LAB — HEMOGLOBIN A1C
Est. average glucose Bld gHb Est-mCnc: 117 mg/dL
Hgb A1c MFr Bld: 5.7 % — ABNORMAL HIGH (ref 4.8–5.6)

## 2021-12-30 ENCOUNTER — Ambulatory Visit: Payer: Self-pay | Admitting: Internal Medicine

## 2021-12-30 ENCOUNTER — Other Ambulatory Visit: Payer: Self-pay

## 2021-12-30 ENCOUNTER — Encounter: Payer: Self-pay | Admitting: Internal Medicine

## 2021-12-30 VITALS — BP 102/60 | HR 68 | Resp 16 | Ht 62.0 in | Wt 144.0 lb

## 2021-12-30 DIAGNOSIS — R7303 Prediabetes: Secondary | ICD-10-CM

## 2021-12-30 DIAGNOSIS — K581 Irritable bowel syndrome with constipation: Secondary | ICD-10-CM

## 2021-12-30 DIAGNOSIS — M7711 Lateral epicondylitis, right elbow: Secondary | ICD-10-CM

## 2021-12-30 DIAGNOSIS — M7712 Lateral epicondylitis, left elbow: Secondary | ICD-10-CM

## 2021-12-30 DIAGNOSIS — E782 Mixed hyperlipidemia: Secondary | ICD-10-CM

## 2021-12-30 NOTE — Patient Instructions (Addendum)
[facility] [employer] [facility], Radiation protection practitioner.  Metamucil dos veces al dia con mas agua.

## 2021-12-30 NOTE — Progress Notes (Signed)
    Subjective:    Patient ID: Olivia Atkinson, female   DOB: 1976/10/05, 46 y.o.   MRN: [id number]   HPI  Interpreted by [name]   Bilateral epicondylitis, lateral:  When she wears the splints, feels better--shows she is wearing the brace right over the area of pain/elbow--cannot use elbow appropriately due to decreased ROM when has brace up on the joint.  She also only purchased one splint for both elbows.    She only is taking ibuprofen when the pain is really bad.    2.  IBS, constipation dominant:  much better.  No longer using the PPI and using Metamucil daily.  Able to eat spicy food again without issues.  Stools still on and off hard and soft.   Did get some LLQ pain a few days ago when could not pass a stool.  Once passed a stool, the pain resolved.    3.  Short lived sharp pain in right low back about 2 days prior to LLQ pain.  Took ibuprofen and went away.  Has not recurred.  Cannot remember overdoing or injuring.    4.  Hypercholesterolemia:  was able to get cholesterol/LDL down with significant change to diet.  Unfortunately, always hungry.   She is not walking, however.  Lipid Panel     Component Value Date/Time   CHOL 204 (H) 12/27/2021 0857   TRIG 110 12/27/2021 0857   HDL 53 12/27/2021 0857   CHOLHDL 4.8 Ratio 06/18/2008 2106   VLDL 31 06/18/2008 2106   LDLCALC 131 (H) 12/27/2021 0857   LABVLDL 20 12/27/2021 0857      5.  Prediabetes:  dietary changes as above.  A1C now down to 5.7%.  Long discussion regarding constant hunger.    Current Meds  Medication Sig   cetirizine (ZYRTEC) 10 MG tablet Take 10 mg by mouth daily.   ibuprofen (ADVIL) 200 MG tablet 3 tabs with food twice daily for 2 weeks.   Melatonin 10 MG TABS Take 10 mg by mouth at bedtime.   psyllium (METAMUCIL) 58.6 % packet Take 1 packet by mouth daily.   No Known Allergies   Review of Systems    Objective:   BP 102/60 (BP Location: Left Arm, Patient Position: Sitting, Cuff Size:  Normal)   Pulse 68   Resp 16   Ht 5\' 2"  (1.575 m)   Wt 144 lb (65.3 kg)   LMP 12/19/2021 (Exact Date)   BMI 26.34 kg/m   Physical Exam NAD Lungs:  CTA CV:  RRR  UE:  tender over bilateral lateral epicondyles at elbows.   Abd:  S, NT, No HSM or mass. + BS   Assessment & Plan    Bilateral Lateral Epicondylitis:  Start over with Ibuprofen twice daily for 14 days and using splints (to purchase another for other elbow) all day long for at least a month.  Showed her how to place the splint.    2.  Hypercholesterolemia and Prediabetes:  much improved control with lifestyle changes.    3. IBS, constipation dominant:  encouraged increasing daily dose of Metamucil.

## 2021-12-31 ENCOUNTER — Ambulatory Visit: Payer: Self-pay | Admitting: Internal Medicine

## 2022-02-23 ENCOUNTER — Telehealth: Payer: Self-pay

## 2022-02-23 NOTE — Telephone Encounter (Signed)
Patient recommended to use Flonase 2 sprays in each nostril daily. Also recommended to use loratadine 10mg  daily. Patient told to hold off on Nyquil for allergy symptoms. Instructed to call if issue continues  ?

## 2022-02-23 NOTE — Telephone Encounter (Signed)
Patient would like recommendation for issue with seasonal allergies. Patient has experienced itch eyes and throat, congestions, and runny nose (clear mucous). Patient has tried taking cetrizine 10mg  and Nyquil, but it has not helped any. ?

## 2022-05-09 ENCOUNTER — Telehealth: Payer: Self-pay

## 2022-05-09 NOTE — Telephone Encounter (Unsigned)
Patient would like an appointment for vaginal discomfort. Has had this issue for about 3-2 weeks. Only has issue during intercourse. No other symptoms. Has not taken any medication for th

## 2022-05-11 ENCOUNTER — Ambulatory Visit: Payer: Self-pay | Admitting: Internal Medicine

## 2022-05-11 ENCOUNTER — Encounter: Payer: Self-pay | Admitting: Internal Medicine

## 2022-05-11 VITALS — BP 106/60 | HR 72 | Resp 16 | Ht 62.0 in | Wt 145.0 lb

## 2022-05-11 DIAGNOSIS — N949 Unspecified condition associated with female genital organs and menstrual cycle: Secondary | ICD-10-CM

## 2022-05-11 DIAGNOSIS — N841 Polyp of cervix uteri: Secondary | ICD-10-CM

## 2022-05-11 DIAGNOSIS — W57XXXA Bitten or stung by nonvenomous insect and other nonvenomous arthropods, initial encounter: Secondary | ICD-10-CM

## 2022-05-11 DIAGNOSIS — S90861A Insect bite (nonvenomous), right foot, initial encounter: Secondary | ICD-10-CM

## 2022-05-11 LAB — POCT WET PREP WITH KOH
Clue Cells Wet Prep HPF POC: NEGATIVE
KOH Prep POC: NEGATIVE
RBC Wet Prep HPF POC: NEGATIVE
Trichomonas, UA: NEGATIVE
Yeast Wet Prep HPF POC: NEGATIVE

## 2022-05-11 MED ORDER — DICYCLOMINE HCL 20 MG PO TABS: 20.0000 mg | ORAL_TABLET | Freq: Four times a day (QID) | ORAL | 11 refills | Status: DC

## 2022-05-11 NOTE — Telephone Encounter (Signed)
Patient has appointment 05/11/2022

## 2022-05-11 NOTE — Progress Notes (Signed)
    Subjective:    Patient ID: Olivia Atkinson, female   DOB: 1975/12/06, 46 y.o.   MRN: [id number]   HPI  Tildon Husky interprets   Vaginal discomfort:  Has had for 3 weeks.  Burns with intercourse.  Later, states only has discomfort with deep penetration.  No discomfort otherwise.  No discharge, no odor.  No urinary complaints.  Started right before getting period in April.  Has not had a period since April, which is not unusual feeling for her.  He has not complained to her of any GU complaints.   2.  Tick Bite:  Just below and behind her right lateral malleolus.  Had been outside dealing with trash can and came in--about 1/2 hour later noted some discomfort in the area noted.  Removed a very small, hard tick--was not filled with blood.  Occurred last Thursday, about 1 week ago.  Has not had any rash about the lesion      Current Meds  Medication Sig   cetirizine (ZYRTEC) 10 MG tablet Take 10 mg by mouth daily.   hydrocortisone cream 1 % Apply 1 Application topically 2 (two) times daily.   ibuprofen (ADVIL) 200 MG tablet 3 tabs with food twice daily for 2 weeks. (Patient taking differently: every 6 (six) hours as needed.)   Melatonin 10 MG TABS Take 10 mg by mouth at bedtime.   psyllium (METAMUCIL) 58.6 % packet Take 1 packet by mouth daily.   No Known Allergies   Review of Systems    Objective:   BP 106/60 (BP Location: Right Arm, Patient Position: Sitting, Cuff Size: Small)   Pulse 72   Resp 16   Ht 5\' 2"  (1.575 m)   Wt 145 lb (65.8 kg)   LMP 03/13/2022 (Exact Date)   BMI 26.52 kg/m   Physical Exam NAD GU:  Normal external female genitalia.  No vaginal discharge.  No erythema of mucosa.  + small cervical polyp.  Discomfort with direct pressure over polyp on bimanual, but NT and without uterine mass or enlargement when not directly over polyp.  No adnexal mass or tenderness.  Right foot with thickened 1/2 cm mildly elevated area where tick reportedly attached  previously.  No surrounding rash.    Assessment & Plan    Cervical polyp:  wet prep unremarkable.  Sending GC/chlamydia, but feel the cervical polyp is most likely causing her discomfort.  Referral to [facility] for [facility].  Will need to apply for [facility].  2.  Tick bite:  follow.  Likely no concern as not attached long and no other symptoms/findings 1 week later.  3.  IBS:  refill dicyclomine.

## 2022-05-13 LAB — GC/CHLAMYDIA PROBE AMP
Chlamydia trachomatis, NAA: NEGATIVE
Neisseria Gonorrhoeae by PCR: NEGATIVE

## 2022-05-21 DIAGNOSIS — N841 Polyp of cervix uteri: Secondary | ICD-10-CM

## 2022-05-21 HISTORY — DX: Polyp of cervix uteri: N84.1

## 2022-06-13 ENCOUNTER — Ambulatory Visit: Payer: Self-pay | Admitting: Internal Medicine

## 2022-06-13 ENCOUNTER — Encounter: Payer: Self-pay | Admitting: Internal Medicine

## 2022-06-13 VITALS — BP 120/70 | HR 68 | Resp 12 | Ht 62.0 in | Wt 146.0 lb

## 2022-06-13 DIAGNOSIS — M940 Chondrocostal junction syndrome [Tietze]: Secondary | ICD-10-CM

## 2022-06-13 DIAGNOSIS — M7701 Medial epicondylitis, right elbow: Secondary | ICD-10-CM

## 2022-06-13 DIAGNOSIS — M7712 Lateral epicondylitis, left elbow: Secondary | ICD-10-CM

## 2022-06-13 DIAGNOSIS — M5431 Sciatica, right side: Secondary | ICD-10-CM

## 2022-06-13 DIAGNOSIS — M5432 Sciatica, left side: Secondary | ICD-10-CM

## 2022-06-13 MED ORDER — IBUPROFEN 200 MG PO TABS
ORAL_TABLET | ORAL | 0 refills | Status: DC
Start: 2022-06-13 — End: 2022-12-21

## 2022-06-13 NOTE — Progress Notes (Signed)
    Subjective:    Patient ID: Olivia Atkinson, female   DOB: [birth year], 47 y.o.   MRN: [id number]   HPI  Tildon Husky interprets   Pain in sternal area:  3 weeks ago:  Sharp pulsing pain and radiated more so to right.  Also radiated to her thoracic back.  Did not note it to be worse when lying back.   The discomfort would come and go. If she pressed on her chest, would make it worse.  Did also extend to what she feels is breast tissue going up into her axillae.  Ultimately clarified, pain did not occur spontaneously, but she could elicit the pain when pressing on her anterior chest.   Stopped feeling the pain after about 14 days.   Does not remember any overuse injury with chest prior to the pain starting.   She states it almost felt like her breast discomfort prior to her period, but she never had a period.  Last period was in April.  Periods have not been regular for a year.   She did not take any medication for the pain, including ibuprofen, which generally works for musculoskeletal type pain for her.    .2.  Pain over medial elbow on right and pain on lateral elbow on left.  Right handed.       Current Meds  Medication Sig   cetirizine (ZYRTEC) 10 MG tablet Take 10 mg by mouth daily.   dicyclomine (BENTYL) 20 MG tablet Take 1 tablet (20 mg total) by mouth every 6 (six) hours.   ibuprofen (ADVIL) 200 MG tablet 3 tabs with food twice daily for 2 weeks. (Patient taking differently: every 6 (six) hours as needed.)   Melatonin 10 MG TABS Take 10 mg by mouth at bedtime.   psyllium (METAMUCIL) 58.6 % packet Take 1 packet by mouth daily.   No Known Allergies   Review of Systems    Objective:   BP 120/70 (BP Location: Right Arm, Patient Position: Sitting, Cuff Size: Normal)   Pulse 68   Resp 12   Ht 5\' 2"  (1.575 m)   Wt 146 lb (66.2 kg)   BMI 26.70 kg/m   Physical Exam NAD HEENT:  PERRL, EOMI, TMs pearly gray, throat without injection Neck:  Supple,  no  adenopathy Lungs:  CTA Chest:  tender over sternum, particularly on right with sternocostal joints.  Reproduces her chest pain.   CV:  RRR without murmur or rub.  Radial and DP pulses normal and equal Abd:  S, NT, No HSM or mass, + BS:   LE:  No edema:  neuro of LE:  MOtor 5/5, DTRs 2+/4.  Normal sensation to light touchl LUE:  tender over lateral epicondyle, left elbow and medial epicondyle of right.   Assessment & Plan    Costochondritis:  ibuprofen 600 mg twice daily with meals for 14 days.  Referral to [facility].  Also for #2, #3.    2.  Epicondylitis:  she already has velcro arm splints.  3.  BTW:  low back pain with radicular pain to bilateral LE:  [facility] as well.

## 2022-08-26 ENCOUNTER — Other Ambulatory Visit: Payer: Self-pay

## 2022-08-29 ENCOUNTER — Other Ambulatory Visit: Payer: Self-pay

## 2022-08-29 DIAGNOSIS — E782 Mixed hyperlipidemia: Secondary | ICD-10-CM

## 2022-08-29 DIAGNOSIS — R7303 Prediabetes: Secondary | ICD-10-CM

## 2022-08-29 DIAGNOSIS — Z79899 Other long term (current) drug therapy: Secondary | ICD-10-CM

## 2022-08-30 LAB — COMPREHENSIVE METABOLIC PANEL
ALT: 36 IU/L — ABNORMAL HIGH (ref 0–32)
AST: 33 IU/L (ref 0–40)
Albumin/Globulin Ratio: 1.4 (ref 1.2–2.2)
Albumin: 4.2 g/dL (ref 3.9–4.9)
Alkaline Phosphatase: 60 IU/L (ref 44–121)
BUN/Creatinine Ratio: 21 (ref 9–23)
BUN: 11 mg/dL (ref 6–24)
Bilirubin Total: <0.2 mg/dL (ref 0.0–1.2)
CO2: 22 mmol/L (ref 20–29)
Calcium: 8.9 mg/dL (ref 8.7–10.2)
Chloride: 106 mmol/L (ref 96–106)
Creatinine, Ser: 0.53 mg/dL — ABNORMAL LOW (ref 0.57–1.00)
Globulin, Total: 2.9 g/dL (ref 1.5–4.5)
Glucose: 85 mg/dL (ref 70–99)
Potassium: 4 mmol/L (ref 3.5–5.2)
Sodium: 139 mmol/L (ref 134–144)
Total Protein: 7.1 g/dL (ref 6.0–8.5)
eGFR: 115 mL/min/{1.73_m2} (ref >59)

## 2022-08-30 LAB — CBC WITH DIFFERENTIAL/PLATELET
Basophils Absolute: 0.1 10*3/uL (ref 0.0–0.2)
Basos: 1 %
EOS (ABSOLUTE): 0.1 10*3/uL (ref 0.0–0.4)
Eos: 2 %
Hematocrit: 37.1 % (ref 34.0–46.6)
Hemoglobin: 12 g/dL (ref 11.1–15.9)
Immature Grans (Abs): 0 10*3/uL (ref 0.0–0.1)
Immature Granulocytes: 0 %
Lymphocytes Absolute: 1.6 10*3/uL (ref 0.7–3.1)
Lymphs: 37 %
MCH: 29 pg (ref 26.6–33.0)
MCHC: 32.3 g/dL (ref 31.5–35.7)
MCV: 90 fL (ref 79–97)
Monocytes Absolute: 0.4 10*3/uL (ref 0.1–0.9)
Monocytes: 9 %
Neutrophils Absolute: 2.2 10*3/uL (ref 1.4–7.0)
Neutrophils: 51 %
Platelets: 258 10*3/uL (ref 150–450)
RBC: 4.14 x10E6/uL (ref 3.77–5.28)
RDW: 12.5 % (ref 11.7–15.4)
WBC: 4.3 10*3/uL (ref 3.4–10.8)

## 2022-08-30 LAB — LIPID PANEL W/O CHOL/HDL RATIO
Cholesterol, Total: 197 mg/dL (ref 100–199)
HDL: 46 mg/dL (ref >39)
LDL Chol Calc (NIH): 125 mg/dL — ABNORMAL HIGH (ref 0–99)
Triglycerides: 145 mg/dL (ref 0–149)
VLDL Cholesterol Cal: 26 mg/dL (ref 5–40)

## 2022-08-30 LAB — HEMOGLOBIN A1C
Est. average glucose Bld gHb Est-mCnc: 117 mg/dL
Hgb A1c MFr Bld: 5.7 % — ABNORMAL HIGH (ref 4.8–5.6)

## 2022-09-02 ENCOUNTER — Ambulatory Visit: Payer: Self-pay | Admitting: Internal Medicine

## 2022-09-02 ENCOUNTER — Encounter: Payer: Self-pay | Admitting: Internal Medicine

## 2022-09-02 VITALS — BP 110/64 | HR 72 | Resp 12 | Ht 61.5 in | Wt 144.0 lb

## 2022-09-02 DIAGNOSIS — E782 Mixed hyperlipidemia: Secondary | ICD-10-CM

## 2022-09-02 DIAGNOSIS — N841 Polyp of cervix uteri: Secondary | ICD-10-CM

## 2022-09-02 DIAGNOSIS — R7303 Prediabetes: Secondary | ICD-10-CM

## 2022-09-02 DIAGNOSIS — Z0000 Encounter for general adult medical examination without abnormal findings: Secondary | ICD-10-CM

## 2022-09-02 DIAGNOSIS — Z1231 Encounter for screening mammogram for malignant neoplasm of breast: Secondary | ICD-10-CM

## 2022-09-02 DIAGNOSIS — Z23 Encounter for immunization: Secondary | ICD-10-CM

## 2022-09-02 NOTE — Progress Notes (Unsigned)
Subjective:    Patient ID: Olivia Atkinson, female   DOB: 1976/02/07, 46 y.o.   MRN: [id number]   HPI  CPE without pap  1.  Pap:  Normal pap 08/26/2020 with benign reparative changes.  Found to have a cervical polyp in July and referred to Gyn.  Was to be called for appt this month, but has not yet heard anything.    2.  Mammogram:  Last 11/2020 and normal.  No family history of breast cancer.    3.  Osteoprevention:  2 servings of milk daily.  Willing to increase to 3 servings daily.  Walks regularly 30-40 minutes 2-3 times weekly.    4.  Guaiac Cards/FIT: Negative 08/2021  5.  Colonoscopy:  Never.  No family history of colon cancer.    6.  Immunizations:   Immunization History  Administered Date(s) Administered   Influenza,inj,Quad PF,6+ Mos 09/23/2016, 09/02/2022   Influenza,inj,quad, With Preservative 09/12/2019   Influenza-Unspecified 09/11/2020, 08/23/2021   PFIZER(Purple Top)SARS-COV-2 Vaccination 05/22/2020, 06/15/2020   Td 09/12/2019   Tdap 08/08/2009     7.  Glucose/Cholesterol:  Prediabetes with recent A1C at 5.7%, stable this year.  Cholesterol generally improved, though HDL also continues to drop.   Lipid Panel     Component Value Date/Time   CHOL 197 08/29/2022 0843   TRIG 145 08/29/2022 0843   HDL 46 08/29/2022 0843   CHOLHDL 4.8 Ratio 06/18/2008 2106   VLDL 31 06/18/2008 2106   LDLCALC 125 (H) 08/29/2022 0843   LABVLDL 26 08/29/2022 0843     Current Meds  Medication Sig   cetirizine (ZYRTEC) 10 MG tablet Take 10 mg by mouth daily.   Cyanocobalamin (VITAMIN B-12 PO) Take by mouth daily.   dicyclomine (BENTYL) 20 MG tablet Take 1 tablet (20 mg total) by mouth every 6 (six) hours.   Melatonin 10 MG TABS Take 10 mg by mouth at bedtime.   psyllium (METAMUCIL) 58.6 % packet Take 1 packet by mouth daily.   No Known Allergies  Past Medical History:  Diagnosis Date   Cervical polyp 05/2022   Hypercholesteremia    IBS (irritable bowel  syndrome)    Lateral epicondylitis of both elbows 2023   Prediabetes 2011   Past Surgical History:  Procedure Laterality Date   CESAREAN SECTION  1999   Family History  Problem Relation Age of Onset   Diabetes Mother    Hypertension Mother    Hyperlipidemia Mother    Hyperlipidemia Father    Hypertension Father    Cancer Maternal Grandfather        skin cancer   Diabetes Maternal Grandfather    Diabetes Paternal Grandmother    Cancer Paternal Grandmother        Throat   Heart disease Paternal Grandfather    Breast cancer Neg Hx    Social History   Socioeconomic History   Marital status: Domestic Partner    Spouse name: Jose   Number of children: 3   Years of education: 6   Highest education level: 6th grade  Occupational History   Occupation: Games developer alterations.  Tobacco Use   Smoking status: Never    Passive exposure: Never   Smokeless tobacco: Never  Vaping Use   Vaping Use: Never used  Substance and Sexual Activity   Alcohol use: No   Drug use: No   Sexual activity: Yes    Birth control/protection: None  Other Topics Concern   Not on file  Social History Narrative  Lives at home with long term boyfriend as well as their 3 children   And a dog, [place]   Social Determinants of Health   Financial Resource Strain: Low Risk  (09/02/2022)   Overall Financial Resource Strain (CARDIA)    Difficulty of Paying Living Expenses: Not hard at all  Food Insecurity: No Food Insecurity (09/02/2022)   Hunger Vital Sign    Worried About Running Out of Food in the Last Year: Never true    Ran Out of Food in the Last Year: Never true  Transportation Needs: No Transportation Needs (09/02/2022)   PRAPARE - Administrator, Civil Service (Medical): No    Lack of Transportation (Non-Medical): No  Physical Activity: Not on file  Stress: Not on file  Social Connections: Moderately Integrated (07/08/2019)   Social Connection and Isolation Panel [NHANES]     Frequency of Communication with Friends and Family: Three times a week    Frequency of Social Gatherings with Friends and Family: Three times a week    Attends Religious Services: More than 4 times per year    Active Member of Clubs or Organizations: No    Attends Banker Meetings: Never    Marital Status: Married  Catering manager Violence: Not At Risk (09/02/2022)   Humiliation, Afraid, Rape, and Kick questionnaire    Fear of Current or Ex-Partner: No    Emotionally Abused: No    Physically Abused: No    Sexually Abused: No      Review of Systems  Genitourinary:        Not really having intercourse to know if still with discomfort.  Previously manipulation of polyp reproduced the discomfort she was having with intercourse      Objective:   BP 110/64 (BP Location: Right Arm, Patient Position: Sitting, Cuff Size: Normal)   Pulse 72   Resp 12   Ht 5' 1.5" (1.562 m)   Wt 144 lb (65.3 kg)   BMI 26.77 kg/m   Physical Exam HENT:     Head: Normocephalic and atraumatic.     Right Ear: Tympanic membrane, ear canal and external ear normal.     Left Ear: Tympanic membrane, ear canal and external ear normal.     Nose: Nose normal.     Mouth/Throat:     Mouth: Mucous membranes are moist.     Pharynx: Oropharynx is clear.  Eyes:     Extraocular Movements: Extraocular movements intact.     Conjunctiva/sclera: Conjunctivae normal.     Pupils: Pupils are equal, round, and reactive to light.     Comments: Discs sharp bilaterally  Neck:     Thyroid: No thyroid mass or thyromegaly.  Cardiovascular:     Rate and Rhythm: Normal rate and regular rhythm.     Heart sounds: S1 normal and S2 normal. No murmur heard.    No friction rub. No S3 or S4 sounds.     Comments: No carotid bruits.  Carotid, radial, femoral, DP and PT pulses normal and equal.    Pulmonary:     Effort: Pulmonary effort is normal.     Breath sounds: Normal breath sounds.  Chest:  Breasts:    Right:  No inverted nipple, mass or nipple discharge.     Left: No inverted nipple, mass or nipple discharge.  Abdominal:     General: Bowel sounds are normal.     Palpations: Abdomen is soft. There is no hepatomegaly, splenomegaly or mass.  Tenderness: There is no abdominal tenderness.     Hernia: No hernia is present.  Genitourinary:    Comments: Normal external female genitalia Scant vaginal secretions. No vaginal lesion or inflammation Cervix with small polyp, appears to be attached to right endocervical area. Somewhat friable.  Tender with manipulation of polyp. Musculoskeletal:        General: Normal range of motion.     Cervical back: Normal range of motion and neck supple.     Right lower leg: No edema.     Left lower leg: No edema.  Lymphadenopathy:     Head:     Right side of head: No submental or submandibular adenopathy.     Left side of head: No submental or submandibular adenopathy.     Cervical: No cervical adenopathy.     Upper Body:     Right upper body: No supraclavicular or axillary adenopathy.     Left upper body: No supraclavicular or axillary adenopathy.     Lower Body: No right inguinal adenopathy. No left inguinal adenopathy.  Skin:    General: Skin is warm.     Capillary Refill: Capillary refill takes less than 2 seconds.     Findings: No rash.  Neurological:     General: No focal deficit present.     Mental Status: She is alert and oriented to person, place, and time.     Cranial Nerves: Cranial nerves 2-12 are intact.     Sensory: Sensation is intact.     Motor: Motor function is intact.     Coordination: Coordination is intact.     Gait: Gait is intact.     Deep Tendon Reflexes: Reflexes are normal and symmetric.  Psychiatric:        Speech: Speech normal.        Behavior: Behavior normal. Behavior is cooperative.      Assessment & Plan    CPE without pap FIT to return in 2 weeks.   Influenza vaccine Refuses COVID today--doesn't want with  another vaccine.  2.  Likely Mold issue in home: Referral made to [facility] [place] for possible mold in [place] of home.  3.  Prediabetes:  Maintains much better glucose control  4.  Hyperlipidemia: generally improved, but encouraged increasing physical activity and good fats in diet with HDL dropping.  5.  Cervical polyp:  call into gyn to see when she will be seen. Referral sent in June.  Appears to be similar in appearance as in June.  She refused repeat pap today--will wait for gyn evaluation.

## 2022-09-05 NOTE — Progress Notes (Signed)
Not clear why this separate note made for HCG testing (see visit note from same date) Urine HCG negative.

## 2022-09-15 ENCOUNTER — Other Ambulatory Visit: Payer: Self-pay

## 2022-09-15 DIAGNOSIS — Z1211 Encounter for screening for malignant neoplasm of colon: Secondary | ICD-10-CM

## 2022-09-15 LAB — POC FIT TEST STOOL: Fecal Occult Blood: NEGATIVE

## 2022-10-11 ENCOUNTER — Encounter: Payer: Self-pay | Admitting: Obstetrics and Gynecology

## 2022-10-11 ENCOUNTER — Ambulatory Visit (INDEPENDENT_AMBULATORY_CARE_PROVIDER_SITE_OTHER): Payer: Self-pay | Admitting: Obstetrics and Gynecology

## 2022-10-11 VITALS — BP 108/73 | HR 71 | Ht 63.0 in | Wt 145.7 lb

## 2022-10-11 DIAGNOSIS — Z01419 Encounter for gynecological examination (general) (routine) without abnormal findings: Secondary | ICD-10-CM

## 2022-10-11 NOTE — Progress Notes (Deleted)
Burnign since June Went in to be seen, they saw it and they thought it was due to the polyp Soemtimes strong or not, burning sensation and it starts after they start having intercourse Buring and feeling bad when seh voids as well, and more noticed after having intercourse No lubircation, no dryness noted. No abnormal discharge.  Ense are irregular for the last 2 years Having hot flashes as well  No bleedin gwith or after intercourse   Hx of abnormal pap - none

## 2022-10-11 NOTE — Progress Notes (Signed)
ANNUAL EXAM Patient name: Olivia Atkinson MRN [id number]  Date of birth: 07-11-1976 Chief Complaint:   Gynecologic Exam  History of Present Illness:   Olivia Atkinson is a 46 y.o. G3P3003 being seen today for a routine annual exam.  Burning since June. Went in to be seen, they saw it and they thought it was due to the polyp. Sometimes strong pain, sometimes not. Burning sensation may start after they start having intercourse. Burning and feeling bad when she voids as well, and more noticed after having intercourse. No lubrication, no dryness noted. No abnormal vaginal discharge. Menses are irregular for the last 2 years. Having hot flashes as well  No bleeding with or after intercourse .   Hx of abnormal pap - none   No LMP recorded (lmp unknown). (Menstrual status: Irregular Periods).   The pregnancy intention screening data noted above was reviewed. Potential methods of contraception were discussed. The patient elected to proceed with No data recorded.   Last pap 08/2020. Results were: NILM w/ HRHPV not done. H/O abnormal pap: no Last mammogram: 11/2020. Results were: normal.  Last colonoscopy: n/a     10/11/2022    3:49 PM 07/08/2019   10:23 AM  Depression screen PHQ 2/9  Decreased Interest 0 1  Down, Depressed, Hopeless 0 1  PHQ - 2 Score 0 2  Altered sleeping 1 3  Tired, decreased energy 0 3  Change in appetite 0 0  Feeling bad or failure about yourself  0 1  Trouble concentrating 0 1  Moving slowly or fidgety/restless 0 1  Suicidal thoughts 0 0  PHQ-9 Score 1 11  Difficult doing work/chores  Somewhat difficult        10/11/2022    3:50 PM  GAD 7 : Generalized Anxiety Score  Nervous, Anxious, on Edge 1  Control/stop worrying 1  Worry too much - different things 1  Trouble relaxing 1  Restless 0  Easily annoyed or irritable 1  Afraid - awful might happen 0  Total GAD 7 Score 5     Review of Systems:   Pertinent items are noted in  HPI Denies any headaches, blurred vision, fatigue, shortness of breath, chest pain, abdominal pain, abnormal vaginal discharge/itching/odor/irritation, problems with periods, bowel movements, urination, or intercourse unless otherwise stated above. Pertinent History Reviewed:  Reviewed past medical,surgical, social and family history.  Reviewed problem list, medications and allergies. Physical Assessment:   Vitals:   10/11/22 1546  BP: 108/73  Pulse: 71  Weight: 145 lb 11.2 oz (66.1 kg)  Height: 5\' 3"  (1.6 m)  Body mass index is 25.81 kg/m.        Physical Examination:   General appearance - well appearing, and in no distress  Mental status - alert, oriented to person, place, and time  Psych:  She has a normal mood and affect  Skin - warm and dry, normal color, no suspicious lesions noted  Chest - effort normal, all lung fields clear to auscultation bilaterally  Heart - normal rate and regular rhythm  Breasts - breasts appear normal, no suspicious masses, no skin or nipple changes or axillary nodes  Abdomen - soft, nontender, nondistended, no masses or organomegaly  Pelvic -  VULVA: normal appearing vulva with no masses, tenderness or lesions   VAGINA: normal appearing vagina with normal color and discharge, no lesions   CERVIX: normal appearing cervix without discharge or lesions, no CMT, large polyp noted  Extremities:  No swelling or varicosities noted  Chaperone present for exam  No results found for this or any previous visit (from the past 24 hour(s)).    Assessment & Plan:   1. Well woman exam with routine gynecological exam Pap with [facility], mamo scheduled Given information for atrophic vaginitis and encourage use of lubrication   Offered vaginal estrogen, declined  Offered cervical polypectomy today, declined and would like to return at a later day for removal    No orders of the defined types were placed in this encounter.   Meds: No orders of the defined types  were placed in this encounter.   Follow-up: No follow-ups on file.  Lorriane Shire, MD 10/11/2022 4:05 PM

## 2022-10-11 NOTE — Progress Notes (Signed)
Patient offered Mammogram Scholarship and declined because, as patient informed RN, she has filled out scholarship form. Patient was given number to call, patient needs to call and report how much husband income is and then [facility] will be able to schedule her a mammography screening appointment. VD RN 10/11/22 at 1610

## 2022-11-16 ENCOUNTER — Ambulatory Visit (INDEPENDENT_AMBULATORY_CARE_PROVIDER_SITE_OTHER): Payer: Self-pay | Admitting: Obstetrics and Gynecology

## 2022-11-16 VITALS — BP 93/51 | HR 80 | Wt 149.4 lb

## 2022-11-16 DIAGNOSIS — Z789 Other specified health status: Secondary | ICD-10-CM

## 2022-11-16 DIAGNOSIS — N841 Polyp of cervix uteri: Secondary | ICD-10-CM

## 2022-11-16 NOTE — Progress Notes (Signed)
    GYNECOLOGY VISIT  Patient name: Olivia Atkinson MRN [id number]  Date of birth: 1976-04-03 Chief Complaint:   Follow-up  History:  Olivia Atkinson is a 46 y.o. G3P3003 being seen today for cervical polyp.  No bleeding or abnormal discharge.  Past Medical History:  Diagnosis Date  . Cervical polyp 05/2022  . Gestational diabetes   . Hypercholesteremia   . IBS (irritable bowel syndrome)   . Lateral epicondylitis of both elbows 2023  . Prediabetes 2011    Past Surgical History:  Procedure Laterality Date  . CESAREAN SECTION  1999    The following portions of the patient's history were reviewed and updated as appropriate: allergies, current medications, past family history, past medical history, past social history, past surgical history and problem list.     Review of Systems:  Pertinent items are noted in HPI. Comprehensive review of systems was otherwise negative.   Objective:  Physical Exam BP (!) 93/51   Pulse 80   Wt 149 lb 6.4 oz (67.8 kg)   BMI 26.47 kg/m    Physical Exam Vitals and nursing note reviewed. Exam conducted with a chaperone present.  Constitutional:      Appearance: Normal appearance.  HENT:     Head: Normocephalic and atraumatic.  Pulmonary:     Effort: Pulmonary effort is normal.  Genitourinary:    General: Normal vulva.     Exam position: Lithotomy position.     Vagina: Normal.     Comments: Small 30mm poly/mucoid lesions  at 11 oclock Skin:    General: Skin is warm and dry.  Neurological:     General: No focal deficit present.     Mental Status: She is alert.  Psychiatric:        Mood and Affect: Mood normal.        Behavior: Behavior normal.        Thought Content: Thought content normal.        Judgment: Judgment normal.   Labs and Imaging No results found.  Cervical Polyp Procedure Note Patient identified, informed consent performed, consent signed.   Speculum was placed and the cervix was visualized. There  was no large polyp identified, but a small polypoind projection at external os. 1% lidocaine with epinephrine injected into the cervix at the base as well as into the polyp and instilled within the endocervical canal. The projection was grasped with a kelly and popped with minimal tissue removal. Cytobrush inserted into the endocervical canal and no polyp brought down. Kelly introduced into the endocervix and no polyp/tissue grasped. Procedure was then concluded.  Patient tolerated procedure well.    Assessment & Plan:   1. Cervical polyp NO large polyp seen, attempted removal of smaller lesion, no tissue retrieved, likely nabothian that was disrupted. Monitor for symptoms of bleeding or abnormal discharge and return in 2-3 months for re-evaluation. If no bleeding or abnormal discharge, no further intervention required.   2. Language barrier In person Spanish interpreter used for duration of encounter  Routine preventative health maintenance measures emphasized.  Lorriane Shire, MD [facility], [facility]

## 2022-11-17 ENCOUNTER — Ambulatory Visit: Payer: Self-pay

## 2022-11-17 VITALS — BP 100/60 | HR 84

## 2022-11-17 DIAGNOSIS — Z0130 Encounter for examination of blood pressure without abnormal findings: Secondary | ICD-10-CM

## 2022-12-01 ENCOUNTER — Ambulatory Visit
Admission: RE | Admit: 2022-12-01 | Discharge: 2022-12-01 | Disposition: A | Discharge status: Home / Self Care | Payer: No Typology Code available for payment source | Source: Ambulatory Visit | Attending: Internal Medicine | Admitting: Internal Medicine

## 2022-12-01 DIAGNOSIS — Z1231 Encounter for screening mammogram for malignant neoplasm of breast: Secondary | ICD-10-CM

## 2022-12-21 ENCOUNTER — Encounter: Payer: Self-pay | Admitting: Internal Medicine

## 2022-12-21 ENCOUNTER — Ambulatory Visit: Payer: Self-pay | Admitting: Internal Medicine

## 2022-12-21 VITALS — BP 110/68 | HR 76 | Resp 16 | Ht 63.0 in | Wt 148.0 lb

## 2022-12-21 DIAGNOSIS — R519 Headache, unspecified: Secondary | ICD-10-CM

## 2022-12-21 DIAGNOSIS — G8929 Other chronic pain: Secondary | ICD-10-CM

## 2022-12-21 DIAGNOSIS — R002 Palpitations: Secondary | ICD-10-CM

## 2022-12-21 DIAGNOSIS — N951 Menopausal and female climacteric states: Secondary | ICD-10-CM

## 2022-12-21 DIAGNOSIS — Z0130 Encounter for examination of blood pressure without abnormal findings: Secondary | ICD-10-CM

## 2022-12-21 MED ORDER — RA BLACK COHOSH 200 MG PO CAPS: ORAL_CAPSULE | ORAL | 0 refills | Status: DC

## 2022-12-21 NOTE — Progress Notes (Signed)
Subjective:    Patient ID: Olivia Atkinson, female   DOB: 25-Jan-1976, 47 y.o.   MRN: [id number]   HPI  Olivia Atkinson interprets   Insomnia:  Problem for about 5 months.  Struggling with hot flashes at night--kicks off covers and then really cold.  After awakens, unable to get back to sleep.  Has not tried anything over the counter for sx, such as black cohosh.  2.  HA:  Notes headaches the day after not sleeping well.  Started in September when developed insomnia as above.  Pain starts at left neck and then radiates up and over the left side of head.  She is unable to describe the character of the pain.  States feels "like a lot is going on" and at nape of neck.  Takes 400 mg of ibuprofen when feels the headache start, which helps.  Ultimately, can get very severe and feel like her head will explode if she does not take the ibuprofen.   Headache makes it more difficult for her to sleep and then has vertigo.  Has had this occur perhaps twice.  Has to take ibuprofen to interrupt a headache twice monthly.  3.  Blood pressure concerns:  Was told her blood pressure is low at gynecology visit.  Returned here and had BP check.  He BPs have been running at these levels for years.  Denies chronic light headedness and fatigue.  Does feel tachypalpitations when lies down to sleep at times.  Has never actually checked her pulse when she feels this.  TSH normal in 2021.     Current Meds  Medication Sig   cetirizine (ZYRTEC) 10 MG tablet Take 10 mg by mouth daily.   Cyanocobalamin (VITAMIN B-12 PO) Take by mouth daily.   Melatonin 10 MG TABS Take 10 mg by mouth at bedtime.   psyllium (METAMUCIL) 58.6 % packet Take 1 packet by mouth daily.   No Known Allergies   Review of Systems    Objective:   BP 110/68 (BP Location: Right Arm, Patient Position: Sitting, Cuff Size: Normal)   Pulse 76   Resp 16   Ht 5\' 3"  (1.6 m)   Wt 148 lb (67.1 kg)   BMI 26.22 kg/m   Physical Exam NAD HEENT:   PERRL, EOMI, Discs sharp, TMs pearly gray, throat without injection. Neck:  Supple, No adenopathy, no thyromegaly.  Tender and tight over left trap. Chest:  CTA CV:  RRR without murmur or rub.  No carotid bruits.  Carotid, radial and DP pulses normal and equal Abd:  S NT, No HSM or mass, + BS Neuro:  A & O x 3, CN II-XII grossly intact.  Motor 5/5, DTRs 2+/4 throughout.  SEnsory grossly normal.  Gait normal.  Finger to nose to finger and rapid alternating movements intact.  Romberg negative.     Assessment & Plan    Perimenopause with hot flashes, night sweats and insomnia:  She is already taking Melatonin.  Discussed HRT and the possibility of SSRI as well as [employer].  She elects to try the black cohosh from [facility] first and consider SSRI if not helpful.  Follow up in 3 months.    2.  Headaches, vertigo and palpitations.  No concerning findings on neurologic exam.  She knows now how to abort HA if needed with ibuprofen.  Does seem related to poor sleep and if that is controlled, suspect this will resolve as well.  Encouraged supporting her left  neck with sleep and having her husband massage the left trap area.    3.  Concerns for BP:  discussed her BP has been stable at low normal level for some time and as long as no symptoms, would not worry about it.

## 2022-12-21 NOTE — Patient Instructions (Signed)
[facility] [address]  [place], [place] [address] [phone] 40% Discount for [employer] Patients For Black Cohosh--please go over dosing with her.

## 2022-12-23 LAB — TSH: TSH: 1.22 u[IU]/mL (ref 0.450–4.500)

## 2023-01-17 ENCOUNTER — Encounter: Payer: Self-pay | Admitting: Obstetrics and Gynecology

## 2023-01-17 ENCOUNTER — Ambulatory Visit (INDEPENDENT_AMBULATORY_CARE_PROVIDER_SITE_OTHER): Payer: Self-pay | Admitting: Obstetrics and Gynecology

## 2023-01-17 VITALS — BP 104/67 | HR 83 | Ht 63.0 in | Wt 148.0 lb

## 2023-01-17 DIAGNOSIS — Z789 Other specified health status: Secondary | ICD-10-CM

## 2023-01-17 DIAGNOSIS — N952 Postmenopausal atrophic vaginitis: Secondary | ICD-10-CM

## 2023-01-17 NOTE — Progress Notes (Signed)
    GYNECOLOGY VISIT  Patient name: Lonette Savel MRN [id number]  Date of birth: [birth year] Chief Complaint:   Follow-up  History:  Tazanna Mollere is a 47 y.o. G3P3003 being seen today for followup.  Still l having burning sensation with intercourse. When husband wants to have interocurse, when he immediately penetrates, it will burn and bothers here  but if there is more time prior to penetration, she does not have the same pain. No menses since October. Intermittent breast tenderness of unclear duration.   Just started taking medication to help with hot flashes - noticed they have become more frequent.    Past Medical History:  Diagnosis Date   Cervical polyp 05/2022   Gestational diabetes    Hypercholesteremia    IBS (irritable bowel syndrome)    Lateral epicondylitis of both elbows 2023   Prediabetes 2011    Past Surgical History:  Procedure Laterality Date   CESAREAN SECTION  1999    The following portions of the patient's history were reviewed and updated as appropriate: allergies, current medications, past family history, past medical history, past social history, past surgical history and problem list.   Health Maintenance:   Last pap 08/2020. NILM Last mammogram: 11/2020, normal    Review of Systems:  Pertinent items are noted in HPI. Comprehensive review of systems was otherwise negative.   Objective:  Physical Exam BP 104/67   Pulse 83   Ht 5' 3"$  (1.6 m)   Wt 148 lb (67.1 kg)   LMP 08/21/2022 (Approximate)   BMI 26.22 kg/m    Physical Exam Vitals and nursing note reviewed.  Constitutional:      Appearance: Normal appearance.  HENT:     Head: Normocephalic and atraumatic.  Pulmonary:     Effort: Pulmonary effort is normal.  Skin:    General: Skin is warm and dry.  Neurological:     General: No focal deficit present.     Mental Status: She is alert.  Psychiatric:        Mood and Affect: Mood normal.        Behavior: Behavior  normal.        Thought Content: Thought content normal.        Judgment: Judgment normal.        Assessment & Plan:   1. Vaginal atrophy Hx sounds more consistent with GSM and that burning alleviated with adequate foreplay. Recommend use of lubrication with intercourse as well as foreplay prior to penetration. Previously offered vaginal estrogen, declined. Can use vaginal hyaluronic acid, vaginal moisturizer, coconut oil, etc. Perimenopausal hot flashes and appears to be on black cohosh - recommend monitoring for new symptoms when taking including vaginal bleeding. Noted to have been having some intermittent breast tenderness, unclear if started after taking black cohosh; if persistent, recommend stopping black cohosh and starting local treatment options for vaginal symptoms.   2. Language barrier In person spanish interpreter    Routine preventative health maintenance measures emphasized.  Darliss Cheney, MD [facility] [place]

## 2023-01-17 NOTE — Patient Instructions (Addendum)
Use of lubrication for intercourse   Engage in foreplay prior to penetration .   Astroglide or other lubricant for intercourse   Replens vaginal moisturizer, vaginal hyaluronic acid for vaginal moisture

## 2023-02-20 ENCOUNTER — Ambulatory Visit: Payer: Self-pay | Admitting: Internal Medicine

## 2023-03-21 ENCOUNTER — Ambulatory Visit: Payer: Self-pay | Admitting: Internal Medicine

## 2023-03-21 ENCOUNTER — Encounter: Payer: Self-pay | Admitting: Internal Medicine

## 2023-03-21 VITALS — BP 112/70 | HR 80 | Resp 16 | Ht 63.0 in | Wt 151.0 lb

## 2023-03-21 DIAGNOSIS — N951 Menopausal and female climacteric states: Secondary | ICD-10-CM

## 2023-03-21 DIAGNOSIS — N841 Polyp of cervix uteri: Secondary | ICD-10-CM

## 2023-03-21 NOTE — Progress Notes (Signed)
    Subjective:    Patient ID: Olivia Atkinson, female   DOB: Apr 06, 1976, 47 y.o.   MRN: [id number]   HPI  Olivia Atkinson interprets   Hot flashes/ Night Sweats/Vaginal atrophy and discomfort with intercourse.  States she currently has lubrication adequate for intercourse and not painful.  Hot flashes and night sweats have decreased.  She is sleeping well now also.  She is currently taking black cohosh twice daily.  She is not requiring the Melatonin very frequently now--maybe every two weeks needs to use.   2.  Concerns about previous cervical polyp--down to 3mm size when removed in December by Gyn.  She did not remember that procedure.  Discussed will have follow up with CPE in October.      Current Meds  Medication Sig   Black Cohosh (RA BLACK COHOSH) 200 MG CAPS Use as directed for hot flashes night sweats and insomnia   cetirizine (ZYRTEC) 10 MG tablet Take 10 mg by mouth daily.   Melatonin 10 MG TABS Take 10 mg by mouth at bedtime.   No Known Allergies   Review of Systems    Objective:   BP 112/70 (BP Location: Right Arm, Patient Position: Sitting, Cuff Size: Normal)   Pulse 80   Resp 16   Ht 5\' 3"  (1.6 m)   Wt 151 lb (68.5 kg)   BMI 26.75 kg/m   Physical Exam Lungs:  CTA CV:  RRR without murmur or rub.  Radial pulses normal and equal   Assessment & Plan   Perimenopause:  Discussed taking Black Cohosh 1 cap twice daily for 6 months of for however long she feels she is still having symptoms and then try to wean to once daily and see how she feels.  She may try weaning earlier if she so chooses.  Insomnia much iimproved now as well.    2.  Cervical polyp:  she is aware whatever the lesion was, has been removed.  We will make sure it is healed when she follows up in October for CPE.

## 2023-05-04 ENCOUNTER — Ambulatory Visit: Payer: Self-pay | Admitting: Internal Medicine

## 2023-05-04 ENCOUNTER — Encounter: Payer: Self-pay | Admitting: Internal Medicine

## 2023-05-04 VITALS — BP 110/62 | HR 72 | Resp 16 | Ht 63.0 in | Wt 150.0 lb

## 2023-05-04 DIAGNOSIS — Z780 Asymptomatic menopausal state: Secondary | ICD-10-CM | POA: Insufficient documentation

## 2023-05-04 DIAGNOSIS — K219 Gastro-esophageal reflux disease without esophagitis: Secondary | ICD-10-CM

## 2023-05-04 DIAGNOSIS — G44209 Tension-type headache, unspecified, not intractable: Secondary | ICD-10-CM

## 2023-05-04 MED ORDER — OMEPRAZOLE 20 MG PO CPDR
20.0000 mg | DELAYED_RELEASE_CAPSULE | Freq: Every day | ORAL | 1 refills | Status: AC
Start: 2023-05-04 — End: ?

## 2023-05-04 NOTE — Progress Notes (Signed)
    Subjective:    Patient ID: Olivia Atkinson, female   DOB: 05/23/1976, 47 y.o.   MRN: [id number]   HPI  Olivia Atkinson interprets   Feels she is having acid reflux into her mouth.  Feels pressure in her palate and palate looks irritated.  Started 6 days ago.  Any food she eats, she feels it is burning that area, especially if spicy.  Can awaken with bitter taste in mouth at times as well.  Not necessarily worse with this.  No throat symptoms.  Notes more symptoms of palate after eating. Also points to left supraumbilical area as source of burning discomfort.    Took Omeprazole 20 mg daily since 5 days ago and has noted jaw pain below is better.  She has also decreased onions, tomatoes and caffeine in her diet.  Does drink a glass of wine in the evening at times.  Not clear how often.  No chocolate.  No tobacco.   2.  For 4 days she is having pain in her bilateral jaw and occipital pain/trap areas bilaterally.  She has had more stress recently--kids stress her out  Her oldest daughter is also pregnant.   Next oldest child leaves the home late at night and returns drunk.   She does feel she clenches or grinds teeth in sleep.  3.  Menopausal symptoms:  also adding to her stress--not sleeping well.  She misunderstood recommendations to wean to a lower dose of black cohosh if tolerated.  Instead, just came off completely.  + Hot flashes, night sweats and poor sleep adding to stress.    Current Meds  Medication Sig   cetirizine (ZYRTEC) 10 MG tablet Take 10 mg by mouth daily.   Melatonin 10 MG TABS Take 10 mg by mouth at bedtime.   omeprazole (PRILOSEC) 20 MG capsule Take 20 mg by mouth daily.   psyllium (METAMUCIL) 58.6 % packet Take 1 packet by mouth daily.   No Known Allergies   Review of Systems    Objective:   BP 110/62 (BP Location: Left Arm, Patient Position: Sitting, Cuff Size: Normal)   Pulse 72   Resp 16   Ht 5\' 3"  (1.6 m)   Wt 150 lb (68 kg)   BMI 26.57  kg/m   Physical Exam NAD HEENT:  PERRL EOMI, TMs pearly gray, throat without injection.  Bite surfaces of teeth appear worn more than expected Neck:  Supple, but tender over traps, cervical musculature, including SCM muscles bilaterally and musculature of lower face/jaw. Chest:  CTA CV:  RRR without murmur or rub.  Radial pulses normal and equal ABd:  S, NT, No HSM or mass, + BS   Assessment & Plan   Mild GERD:  GERD info given for diet and elevation of HOB, no lying down for 2 hours after eating.  Omeprazole 20 mg daily for up to 2 months.    2  Suspect jaw clenching/grinding with sleep.  She agrees that she actually does do this.  Purchase bite block and use for sleep nightly.  Neck stretches.  Walk daily.  3.  Menopausal symptoms:  restart black cohosh.  Once symptoms stable, consider decrease to once daily if controls symptoms.

## 2023-08-04 ENCOUNTER — Telehealth: Payer: Self-pay

## 2023-08-04 NOTE — Telephone Encounter (Signed)
Patient would like to be on the wait list. Patient needs an appointment for reflux. Patient was mostly better taking omeprazole. Patient ate spicy foods recently which triggered reflux and is causing her a lot of pain.

## 2023-08-18 NOTE — Telephone Encounter (Signed)
Patient has been scheduled

## 2023-08-22 ENCOUNTER — Encounter: Payer: Self-pay | Admitting: Internal Medicine

## 2023-08-22 ENCOUNTER — Ambulatory Visit: Payer: Self-pay | Admitting: Internal Medicine

## 2023-08-22 VITALS — BP 120/72 | HR 72 | Resp 12 | Ht 63.0 in | Wt 152.0 lb

## 2023-08-22 DIAGNOSIS — M7701 Medial epicondylitis, right elbow: Secondary | ICD-10-CM

## 2023-08-22 DIAGNOSIS — R1013 Epigastric pain: Secondary | ICD-10-CM | POA: Insufficient documentation

## 2023-08-22 DIAGNOSIS — K219 Gastro-esophageal reflux disease without esophagitis: Secondary | ICD-10-CM

## 2023-08-22 DIAGNOSIS — Z23 Encounter for immunization: Secondary | ICD-10-CM

## 2023-08-22 MED ORDER — PANTOPRAZOLE SODIUM 40 MG PO TBEC: DELAYED_RELEASE_TABLET | ORAL | 6 refills | Status: DC

## 2023-08-22 NOTE — Progress Notes (Signed)
    Subjective:    Patient ID: Olivia Atkinson, female   DOB: 1976-05-23, 47 y.o.   MRN: [id number]   HPI  Duayne Cal interprets   GERD:  No improvement.  When she eats spicy foods, still having symptoms.  "Finished" the Omeprazole about 2 weeks ago.  Does not feel the Omeprazole, however, made any difference in her GERD symptoms.    She is however, eating yogurt right before bed as she has burning in her epigastrium, but then with acid in her mouth when she awakens in morning. States she does have HOB elevated.   She also continues to eat chiles as she loves them, despite knowing they increase her symptoms.    2.  Bilateral medial epicondylar pain:  has been to PT twice and using velcro armsplints when using arms without improvement.     Current Meds  Medication Sig   Black Cohosh (RA BLACK COHOSH) 200 MG CAPS Use as directed for hot flashes night sweats and insomnia   cetirizine (ZYRTEC) 10 MG tablet Take 10 mg by mouth daily.   No Known Allergies   Review of Systems    Objective:   BP 120/72 (BP Location: Right Arm, Patient Position: Sitting, Cuff Size: Normal)   Pulse 72   Resp 12   Ht 5\' 3"  (1.6 m)   Wt 152 lb (68.9 kg)   BMI 26.93 kg/m   Physical Exam NAD Belching throughout the interview. Lungs:  CTA CV:  RRR without murmur or rub. Abd:  S + BS, mild epigastric tenderness.  No HSM or mass.   MS:  tender over medial epicondylar attachment bilaterally.  Pain with supination and pronation against opposing force.   Assessment & Plan   GERD and epigastric burning.  Switch to Pantoprazole 40 mg twice daily on empty stomach.  Avoid foods that exacerbate.  Maintain elevation of HOB.  No eating for 2 hours prior to lying down.  Referral to GI for possible EGD/biopsies/check for H. Pylori.  2.  Bilateral medial epicondylitis:  Continue splints.  Referral to ortho as failed conservative treatment.  ?corticosteroid injection?

## 2023-08-23 LAB — CBC WITH DIFFERENTIAL/PLATELET
Basophils Absolute: 0.1 10*3/uL (ref 0.0–0.2)
Basos: 1 %
EOS (ABSOLUTE): 0.1 10*3/uL (ref 0.0–0.4)
Eos: 2 %
Hematocrit: 39.2 % (ref 34.0–46.6)
Hemoglobin: 12.3 g/dL (ref 11.1–15.9)
Immature Grans (Abs): 0 10*3/uL (ref 0.0–0.1)
Immature Granulocytes: 0 %
Lymphocytes Absolute: 2.3 10*3/uL (ref 0.7–3.1)
Lymphs: 38 %
MCH: 28.5 pg (ref 26.6–33.0)
MCHC: 31.4 g/dL — ABNORMAL LOW (ref 31.5–35.7)
MCV: 91 fL (ref 79–97)
Monocytes Absolute: 0.5 10*3/uL (ref 0.1–0.9)
Monocytes: 8 %
Neutrophils Absolute: 3.1 10*3/uL (ref 1.4–7.0)
Neutrophils: 51 %
Platelets: 282 10*3/uL (ref 150–450)
RBC: 4.31 x10E6/uL (ref 3.77–5.28)
RDW: 12.9 % (ref 11.7–15.4)
WBC: 6 10*3/uL (ref 3.4–10.8)

## 2023-09-04 ENCOUNTER — Other Ambulatory Visit: Payer: Self-pay | Admitting: Internal Medicine

## 2023-09-04 ENCOUNTER — Encounter: Payer: Self-pay | Admitting: Internal Medicine

## 2023-09-04 ENCOUNTER — Ambulatory Visit: Payer: Self-pay | Admitting: Internal Medicine

## 2023-09-04 VITALS — BP 118/70 | HR 70 | Resp 16 | Ht 63.0 in | Wt 149.5 lb

## 2023-09-04 DIAGNOSIS — H547 Unspecified visual loss: Secondary | ICD-10-CM

## 2023-09-04 DIAGNOSIS — R7303 Prediabetes: Secondary | ICD-10-CM

## 2023-09-04 DIAGNOSIS — Z124 Encounter for screening for malignant neoplasm of cervix: Secondary | ICD-10-CM

## 2023-09-04 DIAGNOSIS — K0889 Other specified disorders of teeth and supporting structures: Secondary | ICD-10-CM

## 2023-09-04 DIAGNOSIS — E782 Mixed hyperlipidemia: Secondary | ICD-10-CM

## 2023-09-04 DIAGNOSIS — Z0000 Encounter for general adult medical examination without abnormal findings: Secondary | ICD-10-CM

## 2023-09-04 NOTE — Progress Notes (Signed)
Subjective:    Patient ID: Olivia Atkinson, female   DOB: 1976-02-09, 47 y.o.   MRN: [id number]   HPI  CPE with pap  1.  Pap:  Last 08/2020 with benign reparative changes.    2.  Mammogram:  Last in January of 2024 and normal.  No family history of breast cancer.    3.  Osteoprevention:  Describes 3 servings daily of yogurt, kefir, Lactaid milk.  Not very physically active.    4.  Guaiac Cards/FIT:  Last 08/2022 and negative.    5.  Colonoscopy:  Never.  No family history of colon cancer.    6.  Immunizations: Has not had COVID booster this year.  She does not like the 24 hours after getting the vaccine.  Refuses today after discussion.  Immunization History  Administered Date(s) Administered   Influenza, Mdck, Trivalent,PF 6+ MOS(egg free) 08/22/2023   Influenza,inj,Quad PF,6+ Mos 09/23/2016, 09/02/2022   Influenza,inj,quad, With Preservative 09/12/2019   Influenza-Unspecified 09/11/2020, 08/23/2021   PFIZER(Purple Top)SARS-COV-2 Vaccination 05/22/2020, 06/15/2020   Td 09/12/2019   Tdap 08/08/2009     7.  Glucose/Cholesterol:  History of prediabetes and hypercholesterolemia:  Has been improved in past year, but not physically active as above.   8.  Epigastric pain:  not clear if better since starting Pantoprazole.  Ultimately, sounds like she is better.  Has not heard from GI yet.    Current Meds  Medication Sig   Black Cohosh (RA BLACK COHOSH) 200 MG CAPS Use as directed for hot flashes night sweats and insomnia   cetirizine (ZYRTEC) 10 MG tablet Take 10 mg by mouth daily.   pantoprazole (PROTONIX) 40 MG tablet 1 tab by mouth on empty stomach twice daily for 2 months then once daily   No Known Allergies  Past Medical History:  Diagnosis Date   Cervical polyp 05/2022   Gestational diabetes    Hypercholesteremia    IBS (irritable bowel syndrome)    Lateral epicondylitis of both elbows 2023   Prediabetes 2011   Past Surgical History:  Procedure  Laterality Date   CESAREAN SECTION  1999   Family History  Problem Relation Age of Onset   Diabetes Mother    Hypertension Mother    Hyperlipidemia Mother    Arthritis Father    Hyperlipidemia Father    Hypertension Father    Cancer Maternal Grandfather        skin cancer   Diabetes Maternal Grandfather    Diabetes Paternal Grandmother    Cancer Paternal Grandmother        Throat   Heart disease Paternal Grandfather    Breast cancer Neg Hx    Social History   Socioeconomic History   Marital status: Domestic Partner    Spouse name: Jose   Number of children: 3   Years of education: 6   Highest education level: 6th grade  Occupational History   Occupation: [employer].    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  Tobacco Use   Smoking status: Never    Passive exposure: Never   Smokeless tobacco: Never  Vaping Use   Vaping status: Never Used  Substance and Sexual Activity   Alcohol use: No   Drug use: No   Sexual activity: Yes    Birth control/protection: Post-menopausal  Other Topics Concern   Not on file  Social History Narrative   Lives at home with long term boyfriend as well as their 3 children  And a dog, Scooby   Social Determinants of Health   Financial Resource Strain: Low Risk  (09/02/2022)   Overall Financial Resource Strain (CARDIA)    Difficulty of Paying Living Expenses: Not hard at all  Food Insecurity: No Food Insecurity (09/04/2023)   Hunger Vital Sign    Worried About Running Out of Food in the Last Year: Never true    Ran Out of Food in the Last Year: Never true  Transportation Needs: No Transportation Needs (09/04/2023)   PRAPARE - Administrator, Civil Service (Medical): No    Lack of Transportation (Non-Medical): No  Physical Activity: Not on file  Stress: Not on file  Social Connections: Moderately Integrated (07/08/2019)   Social Connection and Isolation Panel [NHANES]    Frequency of Communication with  Friends and Family: Three times a week    Frequency of Social Gatherings with Friends and Family: Three times a week    Attends Religious Services: More than 4 times per year    Active Member of Clubs or Organizations: No    Attends Banker Meetings: Never    Marital Status: Married  Catering manager Violence: Not At Risk (09/04/2023)   Humiliation, Afraid, Rape, and Kick questionnaire    Fear of Current or Ex-Partner: No    Emotionally Abused: No    Physically Abused: No    Sexually Abused: No      Review of Systems  HENT:  Positive for dental problem (problems with pain and food sticking in previous filling.).   Eyes:  Positive for visual disturbance (reading glasses at times.).  Respiratory:  Negative for shortness of breath.   Cardiovascular:  Negative for chest pain, palpitations and leg swelling.  Gastrointestinal:        See epigastric pain complaints from earlier this month.  On pantoprazole.    Neurological:  Positive for numbness (problems in past with hand numbness, but no issue in recent weeks.  Has cock up splints.).      Objective:   BP 118/70 (BP Location: Left Arm, Patient Position: Sitting, Cuff Size: Normal)   Pulse 70   Resp 16   Ht 5\' 3"  (1.6 m)   Wt 149 lb 8 oz (67.8 kg)   LMP 09/03/2022 Comment: No period for 1 year on 09/04/2023  BMI 26.48 kg/m   Physical Exam HENT:     Head: Normocephalic and atraumatic.     Right Ear: Tympanic membrane, ear canal and external ear normal.     Left Ear: Tympanic membrane, ear canal and external ear normal.     Nose: Nose normal.     Mouth/Throat:     Mouth: Mucous membranes are moist.     Pharynx: Oropharynx is clear.  Eyes:     Extraocular Movements: Extraocular movements intact.     Conjunctiva/sclera: Conjunctivae normal.     Pupils: Pupils are equal, round, and reactive to light.  Neck:     Thyroid: No thyroid mass or thyromegaly.  Cardiovascular:     Rate and Rhythm: Normal rate and  regular rhythm.     Heart sounds: S1 normal and S2 normal.     No S3 or S4 sounds.     Comments: No carotid bruits.  Carotid, radial, femoral, DP and PT pulses normal and equal.   Pulmonary:     Effort: Pulmonary effort is normal.     Breath sounds: Normal breath sounds and air entry.  Chest:  Breasts:  Right: No inverted nipple, mass or nipple discharge.     Left: No inverted nipple, mass or nipple discharge.  Abdominal:     General: Bowel sounds are normal.     Palpations: Abdomen is soft. There is no hepatomegaly, splenomegaly or mass.     Tenderness: There is no abdominal tenderness.     Hernia: No hernia is present.  Genitourinary:    Comments: Normal external female genitalia. No cervical lesion, including no cervical polyp. No uterine or adnexal mass or tenderness.   Musculoskeletal:        General: Normal range of motion.     Cervical back: Normal range of motion and neck supple.     Right lower leg: No edema.     Left lower leg: No edema.  Lymphadenopathy:     Head:     Right side of head: No submental or submandibular adenopathy.     Left side of head: No submental or submandibular adenopathy.     Cervical: No cervical adenopathy.     Upper Body:     Right upper body: No supraclavicular or axillary adenopathy.     Left upper body: No supraclavicular or axillary adenopathy.     Lower Body: No right inguinal adenopathy. No left inguinal adenopathy.  Skin:    General: Skin is warm.     Capillary Refill: Capillary refill takes less than 2 seconds.     Findings: No rash.  Neurological:     General: No focal deficit present.     Mental Status: She is alert and oriented to person, place, and time.     Cranial Nerves: Cranial nerves 2-12 are intact.     Sensory: Sensation is intact.     Motor: Motor function is intact.     Coordination: Coordination is intact.     Gait: Gait is intact.     Deep Tendon Reflexes: Reflexes are normal and symmetric.  Psychiatric:         Behavior: Behavior normal. Behavior is cooperative.      Assessment & Plan   CPE with pap Next Mammogram in 11/2024 Refusing COVID booster FIT to return in 2 weeks. CMP  2.  Prediabetes:  A1C  3.  Hypercholesterolemia:  FLP  4.  Epigastric pain:  seems better with switch to higher dose Pantoprazole.  Await GI consult.  5.  Black mold around windows:  she will check with partner as to whether okay to proceed with [facility] referral.  6.  Dental pain and cavities:  dental referral  7  Decreased visual acuity:  eye referral

## 2023-09-05 ENCOUNTER — Encounter: Payer: Self-pay | Admitting: Physician Assistant

## 2023-09-05 LAB — COMPREHENSIVE METABOLIC PANEL
ALT: 18 [IU]/L (ref 0–32)
AST: 20 [IU]/L (ref 0–40)
Albumin: 4.5 g/dL (ref 3.9–4.9)
Alkaline Phosphatase: 71 [IU]/L (ref 44–121)
BUN/Creatinine Ratio: 33 — ABNORMAL HIGH (ref 9–23)
BUN: 21 mg/dL (ref 6–24)
Bilirubin Total: 0.3 mg/dL (ref 0.0–1.2)
CO2: 24 mmol/L (ref 20–29)
Calcium: 9.6 mg/dL (ref 8.7–10.2)
Chloride: 103 mmol/L (ref 96–106)
Creatinine, Ser: 0.64 mg/dL (ref 0.57–1.00)
Globulin, Total: 3.3 g/dL (ref 1.5–4.5)
Glucose: 84 mg/dL (ref 70–99)
Potassium: 3.9 mmol/L (ref 3.5–5.2)
Sodium: 141 mmol/L (ref 134–144)
Total Protein: 7.8 g/dL (ref 6.0–8.5)
eGFR: 110 mL/min/{1.73_m2} (ref >59)

## 2023-09-05 LAB — LIPID PANEL W/O CHOL/HDL RATIO
Cholesterol, Total: 254 mg/dL — ABNORMAL HIGH (ref 100–199)
HDL: 60 mg/dL (ref >39)
LDL Chol Calc (NIH): 173 mg/dL — ABNORMAL HIGH (ref 0–99)
Triglycerides: 118 mg/dL (ref 0–149)
VLDL Cholesterol Cal: 21 mg/dL (ref 5–40)

## 2023-09-05 LAB — HGB A1C W/O EAG: Hgb A1c MFr Bld: 6 % — ABNORMAL HIGH (ref 4.8–5.6)

## 2023-09-06 LAB — CYTOLOGY - PAP

## 2023-09-28 ENCOUNTER — Encounter: Payer: Self-pay | Admitting: Internal Medicine

## 2023-09-28 ENCOUNTER — Ambulatory Visit: Payer: Self-pay | Admitting: Internal Medicine

## 2023-09-28 VITALS — BP 112/70 | HR 84 | Resp 16 | Ht 63.0 in | Wt 152.0 lb

## 2023-09-28 DIAGNOSIS — R0981 Nasal congestion: Secondary | ICD-10-CM

## 2023-09-28 DIAGNOSIS — Z1211 Encounter for screening for malignant neoplasm of colon: Secondary | ICD-10-CM

## 2023-09-28 LAB — POCT INFLUENZA A/B
Influenza A, POC: NEGATIVE
Influenza B, POC: NEGATIVE

## 2023-09-28 LAB — POC FIT TEST STOOL: Fecal Occult Blood: NEGATIVE

## 2023-09-28 LAB — POC COVID19 BINAXNOW: SARS Coronavirus 2 Ag: NEGATIVE

## 2023-09-28 MED ORDER — FLUCONAZOLE 150 MG PO TABS: ORAL_TABLET | ORAL | 0 refills | Status: DC

## 2023-09-28 MED ORDER — AMOXICILLIN-POT CLAVULANATE 875-125 MG PO TABS: ORAL_TABLET | ORAL | 0 refills | Status: DC

## 2023-09-28 NOTE — Patient Instructions (Addendum)
Miconazole 7 for vaginal yeast infection if symptomatic while taking the antibiotic.  If no symptoms while taking, but develops symptoms at end of treatment (vaginal discharge and itching), take the 2 days of Fluconazole once daily.

## 2023-09-28 NOTE — Progress Notes (Signed)
    Subjective:    Patient ID: Dorna Bloom, female   DOB: 1976-04-29, 47 y.o.   MRN: [id number]   HPI  [name] interprets   Nasal congestion since October 26th.  Started with clear nasal discharge as well as cough.  Drank teas to calm the cough.  Still with nasal congestion.  Has had green mucous from nose occasionally with some blood.   She is now having headaches. No pressure behind her eyes or in maxillary area. She does have posterior pharyngeal drainage.   No recent antibiotics. Unknown otc cold remedy.   Not taking her antifhistamine EVeryone at home with similar symptoms.  Current Meds  Medication Sig   Black Cohosh (RA BLACK COHOSH) 200 MG CAPS Use as directed for hot flashes night sweats and insomnia   ibuprofen (ADVIL) 200 MG tablet Take 200 mg by mouth every 6 (six) hours as needed.   pantoprazole (PROTONIX) 40 MG tablet 1 tab by mouth on empty stomach twice daily for 2 months then once daily   No Known Allergies   Review of Systems    Objective:   BP 112/70 (BP Location: Right Arm, Patient Position: Sitting, Cuff Size: Normal)   Pulse 84   Resp 16   Ht 5\' 3"  (1.6 m)   Wt 152 lb (68.9 kg)   LMP 09/03/2022 Comment: No period for 1 year on 09/04/2023  BMI 26.93 kg/m   Physical Exam NAD HEENT:  PERRL, EOMI, TMs pearly gray, throat without injection Tender over max sinuses.  Nasal mucosa red and swollen with yellow discharge in left nostril. Neck:  Supple, No adenopathy Chest:  CTA CV:  RRR without murmur or rub.  Radial and DP pulses normal and equal Abd:  S, NT, No HSm or mass, + BS  COVID and influenza testing negative. Assessment & Plan  1  Sinusitis:  Augmentin 875/125 mg twice daily for 10 days.  Fluconazole 150 mg to use at end of course if symptoms of yeast infection.  Can use miconazole vaginal cream if symptoms develop during treatment.

## 2023-11-20 ENCOUNTER — Encounter: Payer: Self-pay | Admitting: Emergency Medicine

## 2023-11-20 ENCOUNTER — Ambulatory Visit
Admission: EM | Admit: 2023-11-20 | Discharge: 2023-11-20 | Disposition: A | Discharge status: Home / Self Care | Payer: Self-pay | Attending: Physician Assistant | Admitting: Physician Assistant

## 2023-11-20 DIAGNOSIS — M79672 Pain in left foot: Secondary | ICD-10-CM

## 2023-11-20 MED ORDER — PREDNISONE 20 MG PO TABS
40.0000 mg | ORAL_TABLET | Freq: Every day | ORAL | 0 refills | Status: AC
Start: 2023-11-20 — End: 2023-11-25

## 2023-11-20 NOTE — ED Triage Notes (Signed)
Pt reports L foot pain x1 month. No trauma or injury to area. States she woke up with the pain that has gradually worsened. Some short relief with ibuprofen and changing her shoes. Describes stabbing and burning pain. It has not affected her walking and can go through ROM with foot but it does cause increased pain.    Triage completed with Casimiro Needle #[id number]

## 2023-12-06 ENCOUNTER — Other Ambulatory Visit: Payer: Self-pay

## 2023-12-06 ENCOUNTER — Encounter: Payer: Self-pay | Admitting: Physician Assistant

## 2023-12-06 ENCOUNTER — Ambulatory Visit (INDEPENDENT_AMBULATORY_CARE_PROVIDER_SITE_OTHER): Payer: Self-pay | Admitting: Physician Assistant

## 2023-12-06 VITALS — BP 122/72 | HR 83 | Ht 63.0 in | Wt 153.0 lb

## 2023-12-06 DIAGNOSIS — K219 Gastro-esophageal reflux disease without esophagitis: Secondary | ICD-10-CM

## 2023-12-06 DIAGNOSIS — Z1211 Encounter for screening for malignant neoplasm of colon: Secondary | ICD-10-CM

## 2023-12-06 DIAGNOSIS — R7989 Other specified abnormal findings of blood chemistry: Secondary | ICD-10-CM

## 2023-12-06 MED ORDER — PANTOPRAZOLE SODIUM 40 MG PO TBEC: DELAYED_RELEASE_TABLET | ORAL | 6 refills | Status: DC

## 2023-12-06 MED ORDER — FAMOTIDINE 40 MG PO TABS: 40.0000 mg | ORAL_TABLET | Freq: Every day | ORAL | 0 refills | Status: DC

## 2023-12-06 NOTE — Progress Notes (Addendum)
 12/06/2023 Olivia Atkinson [id number] 27-Apr-1976  Referring provider: Ronalee Cocking, MD Primary GI doctor: Dr. Yvone Herd  ASSESSMENT AND PLAN:   Gastroesophageal Reflux Disease (GERD) Reports of burning sensation in the stomach, particularly after consuming spicy foods and ketchup.  Was drinking wine nightly for 6 months prior to the GERD.  Currently on Pantoprazole  since November, which has provided some relief. No dysphagia or melena reported. -Continue Pantoprazole  40 mg twice daily, avoid alcohol, elevate head of bed add on Pepcid  at night -Order stool test for H. pylori.  With Diatherix since patient is on PPI -With minor LFT elevation will get abdominal ultrasound to assess liver and gallbladder -Consider endoscopy if H. pylori test and ultrasound are negative and symptoms persist, patient self-pay and agrees she would prefer to avoid endoscopy if not needed.  Liver Function Mildly elevated ALT noted in October. No symptoms of liver disease reported. Most recent LFTs normal -Order ultrasound of the liver to assess for any structural abnormalities. -Avoid alcohol, weight loss discussed  Colon cancer screening Patient's had negative fit test since 2022 No change in bowel habits, no family history. Continue yearly fit test Can consider colonoscopy when patient has insurance or if she has any symptoms  *Due to language barrier, an interpreter was present during the history-taking and subsequent discussion (and for part of the physical exam) with this patient.   I have reviewed the clinic note as outlined by Santina Cull, PA and agree with the assessment, plan and medical decision making.  Olivia Atkinson presents with symptoms of GERD and heartburn with ongoing symptoms despite PPI therapy.  Agree with assessment for H. pylori.  Also history of previously elevated ALT in the setting of alcohol consumption.  Will follow-up ultrasound results.  She has opted for  colon cancer screening with annual FIT test -continue noninvasive screening.  Olivia Hess, MD   Patient Care Team: Ronalee Cocking, MD as PCP - General (Internal Medicine) Ronalee Cocking, MD (Internal Medicine)  HISTORY OF PRESENT ILLNESS: 48 y.o. spanish speaking self pay female with a past medical history of GERD, IBS with constipation, anxiety, hyperlipidemia, prediabetes and others listed below presents as a new patient for evaluation of epigastric pain.   Labs reviewed from 08/22/2023 show no anemia no leukocytosis, kidney and liver unremarkable.  Did have previous slightly elevated ALT at 36 08/29/2022 FIT negative  09/2023, 08/2022, 09/02/2021 For 6 months she was drinking wine nightly small glass, but has stopped this.  No Nsaids  Discussed the use of AI scribe software for clinical note transcription with the patient, who gave verbal consent to proceed.  History of Present Illness   The patient, with a history of stomach issues, presents with a burning sensation in the stomach that is triggered by certain foods, particularly spicy food and ketchup. The patient reports that the burning sensation has been present for approximately six months and is located in the stomach area. The patient notes that the sensation is not constant but occurs when certain foods are consumed. The patient has been on pantoprazole  since November and reports that the medication has been helpful in managing the symptoms. The patient also mentions a bitter taste in the mouth upon waking up in the morning. The patient denies any trouble swallowing, dark or bloody stool, and reports no use of pain medications such as Aleve or ibuprofen . The patient also denies alcohol consumption, except for a small cup of wine at night for sleep, which has been discontinued.  She  reports that she has never smoked. She has never been exposed to tobacco smoke. She has never used smokeless tobacco. She reports that  she does not drink alcohol and does not use drugs.  RELEVANT GI HISTORY, LABS, IMAGING:  CBC    Component Value Date/Time   WBC 6.0 08/22/2023 1220   WBC 5.9 01/27/2010 2048   RBC 4.31 08/22/2023 1220   RBC 4.71 01/27/2010 2048   HGB 12.3 08/22/2023 1220   HCT 39.2 08/22/2023 1220   PLT 282 08/22/2023 1220   MCV 91 08/22/2023 1220   MCH 28.5 08/22/2023 1220   MCHC 31.4 (L) 08/22/2023 1220   MCHC 33.0 01/27/2010 2048   RDW 12.9 08/22/2023 1220   LYMPHSABS 2.3 08/22/2023 1220   MONOABS 0.4 01/27/2010 2048   EOSABS 0.1 08/22/2023 1220   BASOSABS 0.1 08/22/2023 1220   Recent Labs    08/22/23 1220  HGB 12.3    CMP     Component Value Date/Time   NA 141 09/04/2023 0959   K 3.9 09/04/2023 0959   CL 103 09/04/2023 0959   CO2 24 09/04/2023 0959   GLUCOSE 84 09/04/2023 0959   GLUCOSE 97 01/27/2010 2048   BUN 21 09/04/2023 0959   CREATININE 0.64 09/04/2023 0959   CALCIUM 9.6 09/04/2023 0959   PROT 7.8 09/04/2023 0959   ALBUMIN 4.5 09/04/2023 0959   AST 20 09/04/2023 0959   ALT 18 09/04/2023 0959   ALKPHOS 71 09/04/2023 0959   BILITOT 0.3 09/04/2023 0959   GFRNONAA 110 03/11/2020 1515   GFRAA 127 03/11/2020 1515      Latest Ref Rng & Units 09/04/2023    9:59 AM 08/29/2022    8:43 AM 08/26/2021   10:00 AM  Hepatic Function  Total Protein 6.0 - 8.5 g/dL 7.8  7.1  7.5   Albumin 3.9 - 4.9 g/dL 4.5  4.2  4.5   AST 0 - 40 IU/L 20  33  23   ALT 0 - 32 IU/L 18  36  24   Alk Phosphatase 44 - 121 IU/L 71  60  62   Total Bilirubin 0.0 - 1.2 mg/dL 0.3  <1.6  0.3       Current Medications:     Current Outpatient Medications (Respiratory):    cetirizine (ZYRTEC) 10 MG tablet, Take 10 mg by mouth daily. (Patient not taking: Reported on 12/06/2023)  Current Outpatient Medications (Analgesics):    ibuprofen  (ADVIL ) 200 MG tablet, Take 200 mg by mouth every 6 (six) hours as needed. (Patient not taking: Reported on 12/06/2023)   Current Outpatient Medications (Other):     famotidine  (PEPCID ) 40 MG tablet, Take 1 tablet (40 mg total) by mouth at bedtime.   amoxicillin -clavulanate (AUGMENTIN ) 875-125 MG tablet, 1 tab by mouth twice daily for 10 days (Patient not taking: Reported on 12/06/2023)   Black Cohosh  (RA BLACK COHOSH ) 200 MG CAPS, Use as directed for hot flashes night sweats and insomnia (Patient not taking: Reported on 12/06/2023)   fluconazole  (DIFLUCAN ) 150 MG tablet, 1 tab by mouth daily for 2 days for vaginal itching and discharge after antibiotics. (Patient not taking: Reported on 12/06/2023)   pantoprazole  (PROTONIX ) 40 MG tablet, 1 tab by mouth on empty stomach twice daily for 2 months then once daily  Medical History:  Past Medical History:  Diagnosis Date   Cervical polyp 05/2022   Gestational diabetes    Hypercholesteremia    IBS (irritable bowel syndrome)    Lateral epicondylitis  of both elbows 2023   Prediabetes 2011   Allergies: No Known Allergies   Surgical History:  She  has a past surgical history that includes Cesarean section (1999). Family History:  Her family history includes Arthritis in her father; Cancer in her maternal grandfather and paternal grandmother; Diabetes in her maternal grandfather, mother, and paternal grandmother; Heart disease in her paternal grandfather; Hyperlipidemia in her father and mother; Hypertension in her father and mother.  REVIEW OF SYSTEMS  : All other systems reviewed and negative except where noted in the History of Present Illness.  PHYSICAL EXAM: BP 122/72   Pulse 83   Ht 5\' 3"  (1.6 m)   Wt 153 lb (69.4 kg)   BMI 27.10 kg/m  General Appearance: Well nourished, in no apparent distress. Head:   Normocephalic and atraumatic. Eyes:  sclerae anicteric,conjunctive pink  Respiratory: Respiratory effort normal, BS equal bilaterally without rales, rhonchi, wheezing. Cardio: RRR with no MRGs. Peripheral pulses intact.  Abdomen: Soft,  Obese ,active bowel sounds. mild tenderness in the epigastrium  and in the RUQ. Without guarding and Without rebound.  Negative Murphy's no masses. Rectal: Not evaluated Musculoskeletal: Full ROM, Normal gait. Without edema. Skin:  Dry and intact without significant lesions or rashes Neuro: Alert and  oriented x4;  No focal deficits. Psych:  Cooperative. Normal mood and affect.    Edmonia Gottron, PA-C 3:04 PM

## 2023-12-06 NOTE — Patient Instructions (Signed)
 Your provider has requested that you go to the basement level for lab work before leaving today. Press "B" on the elevator. The lab is located at the first door on the left as you exit the elevator.  You have been scheduled for an abdominal ultrasound at [facility] (1st floor of hospital) on 12/14/2023 at 9:00am. Please arrive 30 minutes prior to your appointment for registration. Make certain not to have anything to eat or drink 6 hours prior to your appointment. Should you need to reschedule your appointment, please contact radiology at [phone]. This test typically takes about 30 minutes to perform.   Tome su medicamento inhibidor de la bomba de protones, pantoprazole  40 mg [employer].  Tome [employer] entre 30 minutos y 1 hora antes de las comidas; esto lo hace ms eficaz.  Evite los alimentos picantes y cidos. Evite los alimentos grasos Limite su consumo de caf, t, alcohol y bebidas carbonatadas. Trabaja para Pharmacologist un peso saludable Mantenga la cabecera de la cama elevada al menos 3 pulgadas con bloques o una almohada de cua si tiene algn sntoma nocturno. Mantngase erguido durante 2 horas despus de comer. Evite comidas y refrigerios tres o cuatro horas antes de [place].   Estamos solicitando una prueba de heces "Diatherix" para que la lleve a casa y la complete.  Usted recibi [employer] un kit de nuestra oficina que contiene todos los suministros necesarios para completar esta prueba.  Lea atentamente las instrucciones de recoleccin de heces proporcionadas en el kit antes de abrir los materiales adjuntos.  O una manera ms fcil es usar papel higinico para limpiarse despus de defecar y usar el hisopo/aplicador proporcionado para obtener un pequeo volumen de heces del papel higinico y Clinical cytogeneticist en el tubo.   Importante recordar: -Chemical engineer en el [place] "puritan opti-swab". -Esta etiqueta debe incluir su nombre completo y fecha de nacimiento.   - Esta etiqueta debe tener la [employer] HORA en que se recolectaron las heces. -Despus de completar la prueba, debe asegurar el tubo en la bolsa para muestras de riesgo biolgico.  -La hoja de informacin de solicitud del laboratorio (incluida la fecha y hora de la recoleccin de la Leisure centre manager) debe colocarse en el bolsillo exterior de la bolsa de muestras de riesgo biolgico y Database administrator al laboratorio de Adult nurse dentro de los 2 das de Financial trader.   Si han pasado ms de [employer] recogida, se le pedir que repita la prueba.  Si falta la etiqueta del tubo con su nombre, fecha de nacimiento, fecha y hora de Production manager, tendr que repetir la prueba.  Cualquier pregunta por favor envenos un mensaje a mi expediente o llame a la oficina al [phone]

## 2023-12-07 ENCOUNTER — Telehealth: Payer: Self-pay

## 2023-12-07 NOTE — Telephone Encounter (Signed)
Patient needs an appointment for left feet pain. Patient stated she went to urgent care and was given steroids, pain decreased for a while and now is back. Pain is always there. Patient also stated if she walks a lot pain gets worst.   We will call patient if there is a cancellation.

## 2023-12-08 ENCOUNTER — Other Ambulatory Visit: Payer: Self-pay

## 2023-12-11 ENCOUNTER — Telehealth: Payer: Self-pay | Admitting: Physician Assistant

## 2023-12-11 ENCOUNTER — Encounter: Payer: Self-pay | Admitting: *Deleted

## 2023-12-11 ENCOUNTER — Other Ambulatory Visit: Payer: Self-pay | Admitting: *Deleted

## 2023-12-11 MED ORDER — BISMUTH SUBSALICYLATE 262 MG PO CHEW: 524.0000 mg | CHEWABLE_TABLET | Freq: Four times a day (QID) | ORAL | 0 refills | Status: AC

## 2023-12-11 MED ORDER — DOXYCYCLINE HYCLATE 100 MG PO TABS: 100.0000 mg | ORAL_TABLET | Freq: Two times a day (BID) | ORAL | 0 refills | Status: AC

## 2023-12-11 MED ORDER — METRONIDAZOLE 500 MG PO TABS: 500.0000 mg | ORAL_TABLET | Freq: Two times a day (BID) | ORAL | 0 refills | Status: AC

## 2023-12-11 NOTE — Progress Notes (Signed)
Positive H. pylori gastritis, will give Quad therapy with medication listed below for 14 days   -bismuth subsalicylate 262 mg 2 tabs 4 times daily (#112)  -metronidazole 500mg  BID (#28) -doxycyline 100mg  BID (#28) -pantoprazole 40 mg twice daily.   I recommend that the patient abstain from all alcohol while taking metronidazole.   To avoid side effects of metronidazole, I recommend that she stick to simple meals and not eat rich or spicy food.  She should always try to take your metronidazole after a meal or snack. Most common side effects include nausea, vomiting, diarrhea, stomach upset and rash.    Eight weeks after completing the treatment, an H pylori stool antigen should be performed to monitor for response to treatment.   Need to start Florastor probiotics twice a day.   Return to the office after completing treatment .

## 2023-12-11 NOTE — Telephone Encounter (Signed)
Positive H. pylori gastritis, will give Quad therapy with medication listed below for 14 days   -bismuth subsalicylate 262 mg 2 tabs 4 times daily (#112)  -metronidazole 500mg  BID (#28) -doxycyline 100mg  BID (#28) -pantoprazole 40 mg twice daily.   I recommend that the patient abstain from all alcohol while taking metronidazole.   To avoid side effects of metronidazole, I recommend that she stick to simple meals and not eat rich or spicy food.  She should always try to take your metronidazole after a meal or snack. Most common side effects include nausea, vomiting, diarrhea, stomach upset and rash.    Eight weeks after completing the treatment, an H pylori stool antigen should be performed to monitor for response to treatment.   Need to start Florastor probiotics twice a day.   Return to the office after completing treatment .

## 2023-12-11 NOTE — Telephone Encounter (Signed)
With the help of [employer], Olivia Atkinson (Spanish Interpreter) ID [phone], I explained H pylori and treatment with patient in detail. Also gave additional recommendations as noted by Quentin Mulling, PA-C. Discussed need to check for H Pylori eradication 8 weeks following completion of treatment. Advised we will call to remind her to have lab. Asked patient to purchase Florastor for twice daily usage in addition to H Pylori medications. Written instructions have also been made available via mychart to patient.

## 2023-12-12 NOTE — Telephone Encounter (Signed)
Patient has been schedule.

## 2023-12-13 ENCOUNTER — Ambulatory Visit: Payer: Self-pay | Admitting: Internal Medicine

## 2023-12-13 ENCOUNTER — Encounter: Payer: Self-pay | Admitting: Internal Medicine

## 2023-12-13 VITALS — BP 114/70 | HR 71 | Resp 16 | Ht 63.0 in | Wt 153.0 lb

## 2023-12-13 DIAGNOSIS — M722 Plantar fascial fibromatosis: Secondary | ICD-10-CM | POA: Insufficient documentation

## 2023-12-13 NOTE — Progress Notes (Signed)
    Subjective:    Patient ID: Olivia Atkinson, female   DOB: Mar 25, 1976, 48 y.o.   MRN: [id number]   HPI  Olivia Atkinson interprets   Left heel pain since November.  Bothers her all day, but worse when walks a lot and then sits and walks again, she has more pain.  She has some pain first thing in the morning, but has slippers at bedside and slides into them before walking.   Her slippers are thick bottomed.  If she wears flatter slippers, more pain.   Hardwood floor at home. Never had this before. Does recall wearing tennis shoes prior to pain that did not have as much cushion.  Stopped wearing, but still with pain.    On hard floors throughout the day. No history of injury.     Current Meds  Medication Sig   bismuth subsalicylate (PEPTO-BISMOL) 262 MG chewable tablet Chew 2 tablets (524 mg total) by mouth 4 (four) times daily for 14 days.   Black Cohosh (RA BLACK COHOSH) 200 MG CAPS Use as directed for hot flashes night sweats and insomnia   cetirizine (ZYRTEC) 10 MG tablet Take 10 mg by mouth daily.   doxycycline (VIBRA-TABS) 100 MG tablet Take 1 tablet (100 mg total) by mouth 2 (two) times daily for 14 days.   famotidine (PEPCID) 40 MG tablet Take 1 tablet (40 mg total) by mouth at bedtime.   metroNIDAZOLE (FLAGYL) 500 MG tablet Take 1 tablet (500 mg total) by mouth 2 (two) times daily for 14 days.   pantoprazole (PROTONIX) 40 MG tablet 1 tab by mouth on empty stomach twice daily for 2 months then once daily   No Known Allergies   Review of Systems    Objective:   BP 114/70 (BP Location: Left Arm, Patient Position: Sitting, Cuff Size: Normal)   Pulse 71   Resp 16   Ht 5\' 3"  (1.6 m)   Wt 153 lb (69.4 kg)   BMI 27.10 kg/m   Physical Exam  Left foot without swelling or erythema.  Tender mainly over central plantar surface of heel.  Some tenderness over proximal 5th Metatarsal.    Assessment & Plan  Left plantar fasciitis Avoid NSAIDS with  GERD/gastritis Stretches before standing and bearing weight Frozen water bottle exercises Heel cups to be use in all shoes Thick heeled slippers No open back thin soled shoes/sandals/flip flops. Referral to [facility].

## 2023-12-13 NOTE — Patient Instructions (Signed)
Heel cups:  [employer] or [facility] [facility] [employer]  Frozen water bottle rolls and foot stretches twice daily  Foot stretches before getting up

## 2023-12-14 ENCOUNTER — Ambulatory Visit (HOSPITAL_COMMUNITY)
Admission: RE | Admit: 2023-12-14 | Discharge: 2023-12-14 | Disposition: A | Discharge status: Home / Self Care | Payer: Self-pay | Source: Ambulatory Visit | Attending: Physician Assistant | Admitting: Physician Assistant

## 2023-12-14 DIAGNOSIS — R7989 Other specified abnormal findings of blood chemistry: Secondary | ICD-10-CM | POA: Insufficient documentation

## 2023-12-14 DIAGNOSIS — K219 Gastro-esophageal reflux disease without esophagitis: Secondary | ICD-10-CM | POA: Insufficient documentation

## 2023-12-21 ENCOUNTER — Telehealth: Payer: Self-pay

## 2023-12-21 NOTE — Telephone Encounter (Signed)
Per Dr. Renne Crigler request if patient is feeling good and she would like to cancel it will be okay.   Contacted patient and asked if she was feeling good in regards to epigastric pain or if she felt like she needed to be seen for elbow pain which she has been seen in the past for.   Patient stated she is feeling good at the moment and would like to cancel both appointments.  Appointments were cancel and future lab and CPE appointments are still scheduled.

## 2023-12-21 NOTE — Telephone Encounter (Signed)
Patient would like to know if she needs to keep her appointments for 12/28/23 and 03/04/2024 as they are appointments for epigastric , patient stated she is already been seen at the GI specialist and would like to know if it is okay to cancel.

## 2023-12-25 ENCOUNTER — Ambulatory Visit (HOSPITAL_BASED_OUTPATIENT_CLINIC_OR_DEPARTMENT_OTHER): Payer: Self-pay | Admitting: Student

## 2023-12-28 ENCOUNTER — Ambulatory Visit: Payer: Self-pay | Admitting: Internal Medicine

## 2024-01-15 ENCOUNTER — Ambulatory Visit (HOSPITAL_BASED_OUTPATIENT_CLINIC_OR_DEPARTMENT_OTHER): Payer: BLUE CROSS/BLUE SHIELD | Admitting: Student

## 2024-01-15 ENCOUNTER — Ambulatory Visit (HOSPITAL_BASED_OUTPATIENT_CLINIC_OR_DEPARTMENT_OTHER): Payer: BLUE CROSS/BLUE SHIELD

## 2024-01-15 ENCOUNTER — Other Ambulatory Visit (HOSPITAL_BASED_OUTPATIENT_CLINIC_OR_DEPARTMENT_OTHER): Payer: Self-pay

## 2024-01-15 DIAGNOSIS — M25521 Pain in right elbow: Secondary | ICD-10-CM | POA: Diagnosis not present

## 2024-01-15 DIAGNOSIS — M7702 Medial epicondylitis, left elbow: Secondary | ICD-10-CM

## 2024-01-15 DIAGNOSIS — M25522 Pain in left elbow: Secondary | ICD-10-CM

## 2024-01-15 DIAGNOSIS — M7701 Medial epicondylitis, right elbow: Secondary | ICD-10-CM | POA: Diagnosis not present

## 2024-01-15 MED ORDER — MELOXICAM 15 MG PO TABS
15.0000 mg | ORAL_TABLET | Freq: Every day | ORAL | 0 refills | Status: AC
  Filled 2024-01-15: qty 14, 14d supply, fill #0

## 2024-01-15 NOTE — Progress Notes (Signed)
 Chief Complaint: Bilateral elbow pain     History of Present Illness:    Olivia Atkinson is a 48 y.o. female presenting today for evaluation of bilateral elbow pain, left worse than right.  This has been ongoing for a number of years without any known injury.  She states that the pain is constant and ranges from mild to moderate in severity.  Pain is located over both medial elbows and is worsened with pressure and some motions of the elbow and wrist.  Denies any popping, swelling, numbness, or tingling.  Has tried topicals over the area but no pain medication.  She is right-hand dominant.   Surgical History:   None  PMH/PSH/Family History/Social History/Meds/Allergies:    Past Medical History:  Diagnosis Date   Cervical polyp 05/2022   Gestational diabetes    Hypercholesteremia    IBS (irritable bowel syndrome)    Lateral epicondylitis of both elbows 2023   Prediabetes 2011   Past Surgical History:  Procedure Laterality Date   CESAREAN SECTION  1999   Social History   Socioeconomic History   Marital status: Domestic Partner    Spouse name: Jose   Number of children: 3   Years of education: 6   Highest education level: 6th grade  Occupational History   Occupation: Civil Service fast streamer alterations.    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   Occupation: talior  Tobacco Use   Smoking status: Never    Passive exposure: Never   Smokeless tobacco: Never  Vaping Use   Vaping status: Never Used  Substance and Sexual Activity   Alcohol use: No   Drug use: No   Sexual activity: Yes    Birth control/protection: Post-menopausal  Other Topics Concern   Not on file  Social History Narrative   Lives at home with long term boyfriend as well as their 3 children   And a dog, Scooby   Social Drivers of Corporate investment banker Strain: Low Risk  (09/02/2022)   Overall Financial Resource Strain (CARDIA)    Difficulty of Paying  Living Expenses: Not hard at all  Food Insecurity: No Food Insecurity (09/04/2023)   Hunger Vital Sign    Worried About Running Out of Food in the Last Year: Never true    Ran Out of Food in the Last Year: Never true  Transportation Needs: No Transportation Needs (09/04/2023)   PRAPARE - Administrator, Civil Service (Medical): No    Lack of Transportation (Non-Medical): No  Physical Activity: Not on file  Stress: Not on file  Social Connections: Moderately Integrated (07/08/2019)   Social Connection and Isolation Panel [NHANES]    Frequency of Communication with Friends and Family: Three times a week    Frequency of Social Gatherings with Friends and Family: Three times a week    Attends Religious Services: More than 4 times per year    Active Member of Clubs or Organizations: No    Attends Banker Meetings: Never    Marital Status: Married   Family History  Problem Relation Age of Onset   Diabetes Mother    Hypertension Mother    Hyperlipidemia Mother    Arthritis Father    Hyperlipidemia Father    Hypertension Father    Cancer Maternal Grandfather  skin cancer   Diabetes Maternal Grandfather    Diabetes Paternal Grandmother    Cancer Paternal Grandmother        Throat   Heart disease Paternal Grandfather    Breast cancer Neg Hx    No Known Allergies Current Outpatient Medications  Medication Sig Dispense Refill   meloxicam (MOBIC) 15 MG tablet Take 1 tablet (15 mg total) by mouth daily for 14 days. 14 tablet 0   Black Cohosh (RA BLACK COHOSH) 200 MG CAPS Use as directed for hot flashes night sweats and insomnia  0   cetirizine (ZYRTEC) 10 MG tablet Take 10 mg by mouth daily.     famotidine (PEPCID) 40 MG tablet Take 1 tablet (40 mg total) by mouth at bedtime. 90 tablet 0   pantoprazole (PROTONIX) 40 MG tablet 1 tab by mouth on empty stomach twice daily for 2 months then once daily 60 tablet 6   No current facility-administered medications  for this visit.   No results found.  Review of Systems:   A ROS was performed including pertinent positives and negatives as documented in the HPI.  Physical Exam :   Constitutional: NAD and appears stated age Neurological: Alert and oriented Psych: Appropriate affect and cooperative There were no vitals taken for this visit.   Comprehensive Musculoskeletal Exam:    Bilateral elbow exam demonstrates tenderness to palpation over the medial epicondyles and common flexor tendon, worse on the left side.  Pain is exacerbated in both elbows with resisted wrist flexion.  5/5 strength with resisted pronation and supination.  Imaging:   Xray (right elbow 3 views, left elbow 3 views): Negative for bony abnormality   I personally reviewed and interpreted the radiographs.   Assessment:   48 y.o. female bilateral elbow pain consistent with medial epicondylitis.  This is more significant of the left elbow.  Has tried a counterforce brace which did provide some relief.  At today's visit I have recommended a course of NSAIDs for which I will start her on meloxicam 15 mg daily.  We also discussed a medial epicondylitis home exercise program for targeted stretching of the area.  Encouraged continued use of the brace.  Should she not see improvements with this over the next 3 to 4 weeks and on I did discuss that she could return for a cortisone injection and further assessment.  Patient is agreeable to plan.  Plan :    -Start meloxicam 15 mg daily -HEP given for medial epicondylitis -Return to clinic as needed     I personally saw and evaluated the patient, and participated in the management and treatment plan.  Hazle Nordmann, PA-C Orthopedics

## 2024-02-20 NOTE — Telephone Encounter (Signed)
 Spoke to patient via [employer](Spanish) Interpreter ID [phone] to remind her to come for H Pylori stool testing to confirm H Pylori eradication. Kit placed at 3rd floor front desk and patient verbalizes understanding.

## 2024-03-04 ENCOUNTER — Ambulatory Visit: Payer: Self-pay | Admitting: Internal Medicine

## 2024-03-06 NOTE — Telephone Encounter (Signed)
 Contacted [employer] and interpreter reached out to patient. Patient verbalized understanding of Diatherix negative H pylori stool test.

## 2024-03-14 ENCOUNTER — Other Ambulatory Visit: Payer: Self-pay

## 2024-03-14 DIAGNOSIS — R7303 Prediabetes: Secondary | ICD-10-CM | POA: Diagnosis not present

## 2024-03-14 DIAGNOSIS — E782 Mixed hyperlipidemia: Secondary | ICD-10-CM

## 2024-03-15 ENCOUNTER — Encounter: Payer: Self-pay | Admitting: Internal Medicine

## 2024-03-15 LAB — LIPID PANEL W/O CHOL/HDL RATIO
Cholesterol, Total: 235 mg/dL — ABNORMAL HIGH (ref 100–199)
HDL: 51 mg/dL (ref >39)
LDL Chol Calc (NIH): 160 mg/dL — ABNORMAL HIGH (ref 0–99)
Triglycerides: 131 mg/dL (ref 0–149)
VLDL Cholesterol Cal: 24 mg/dL (ref 5–40)

## 2024-03-15 LAB — HEMOGLOBIN A1C
Est. average glucose Bld gHb Est-mCnc: 123 mg/dL
Hgb A1c MFr Bld: 5.9 % — ABNORMAL HIGH (ref 4.8–5.6)

## 2024-03-19 ENCOUNTER — Encounter: Payer: Self-pay | Admitting: Physician Assistant

## 2024-06-19 ENCOUNTER — Telehealth: Payer: Self-pay | Admitting: Internal Medicine

## 2024-06-19 ENCOUNTER — Ambulatory Visit: Admitting: Internal Medicine

## 2024-06-19 NOTE — Telephone Encounter (Signed)
 Patient was scheduled for an appointment on 06/19/24 at 10am Patient arrived at 10:16AM . Patient states that she was thinking we had given her the appointment at 10:30AM .  Patient would like to continue to be on wait list for appointment for chest pain and radiates to left arm.

## 2024-06-19 NOTE — Telephone Encounter (Signed)
 Patient has been scheduled

## 2024-06-20 ENCOUNTER — Ambulatory Visit: Admitting: Internal Medicine

## 2024-06-20 VITALS — BP 90/60 | HR 82 | Resp 17 | Ht 63.0 in | Wt 152.0 lb

## 2024-06-20 DIAGNOSIS — E782 Mixed hyperlipidemia: Secondary | ICD-10-CM | POA: Diagnosis not present

## 2024-06-20 DIAGNOSIS — R079 Chest pain, unspecified: Secondary | ICD-10-CM

## 2024-06-20 DIAGNOSIS — R002 Palpitations: Secondary | ICD-10-CM

## 2024-06-20 DIAGNOSIS — R7303 Prediabetes: Secondary | ICD-10-CM

## 2024-06-20 DIAGNOSIS — K219 Gastro-esophageal reflux disease without esophagitis: Secondary | ICD-10-CM

## 2024-06-20 MED ORDER — FAMOTIDINE 40 MG PO TABS: 40.0000 mg | ORAL_TABLET | Freq: Every day | ORAL | 2 refills | Status: AC

## 2024-06-20 MED ORDER — ASPIRIN 81 MG PO TBEC: 81.0000 mg | DELAYED_RELEASE_TABLET | Freq: Every day | ORAL | Status: AC

## 2024-06-20 NOTE — Progress Notes (Signed)
 Subjective:    Patient ID: Olivia Atkinson, female   DOB: 1976/07/07, 48 y.o.   MRN: [id number]   HPI  Erminio Bloomer interprets   Palpitations/tachycardia:  For past month has noted this, but then 1 week ago had an episode of pain in high left anterior chest with radiation into left shoulder and into upper arm.  Happened again 4 days ago.  The episodes of chest pain were not associated with palpitations.  The pain lasted perhaps 15 minutes.  She was cooking dinner with first episode and cannot recall what she was doing with the second episode.  She sat and took deep breaths with both episodes.   Cannot say how often she has the palpitations, but most days.  Generally lasts about 5 minutes.  She does not get light headed with it.   She has had this before, but cannot remember how long ago. She can have them at any time.  She keeps doing her activity and does not last any longer than if she was at rest.   Parents both living, neither with CAD.  Sisters without CAD.   She does have prediabetes with most recent A1C in April at 5.9% Nonsmoker Mildly overweight.   Hypercholesterolemia with improvement in levels in April as well, until this past year, elevation of LDL had been very mild.   Lipid Panel     Component Value Date/Time   CHOL 235 (H) 03/14/2024 0834   TRIG 131 03/14/2024 0834   HDL 51 03/14/2024 0834   CHOLHDL 4.8 Ratio 06/18/2008 2106   VLDL 31 06/18/2008 2106   LDLCALC 160 (H) 03/14/2024 0834   LABVLDL 24 03/14/2024 0834   She has had acid taste in mouth, perhaps after eating meals and at times in the morning.   Can have these symptoms about twice weekly.   She was treated for H. Pylori by GI back in January and had a confirmatory clearance with H.pylori stool antigen negative in 02/2024.   She is not having the epigastric pain and burning. as she had prior to treatment     Current Meds  Medication Sig   Black Cohosh  (RA BLACK COHOSH ) 200 MG CAPS Use as  directed for hot flashes night sweats and insomnia   cetirizine (ZYRTEC) 10 MG tablet Take 10 mg by mouth daily.   No Known Allergies   Review of Systems    Objective:   BP 90/60 (BP Location: Left Arm, Patient Position: Sitting, Cuff Size: Normal)   Pulse 82   Resp 17   Ht 5' 3 (1.6 m)   Wt 152 lb (68.9 kg)   SpO2 98%   BMI 26.93 kg/m   Physical Exam HENT:     Head: Normocephalic and atraumatic.  Eyes:     Extraocular Movements: Extraocular movements intact.     Pupils: Pupils are equal, round, and reactive to light.  Neck:     Thyroid: No thyroid mass or thyromegaly.  Cardiovascular:     Rate and Rhythm: Normal rate and regular rhythm.     Heart sounds: S1 normal and S2 normal. No murmur heard.    No friction rub. No S3 or S4 sounds.     Comments: No carotid bruits.  Carotid, radial, femoral, DP and PT pulses normal and equal.   Pulmonary:     Effort: Pulmonary effort is normal.     Breath sounds: Normal breath sounds and air entry.  Abdominal:     General: Bowel sounds  are normal.     Palpations: Abdomen is soft. There is no hepatomegaly, splenomegaly or mass.     Tenderness: There is no abdominal tenderness.     Hernia: No hernia is present.  Musculoskeletal:     Cervical back: Normal range of motion and neck supple.     Right lower leg: No edema.     Left lower leg: No edema.     Comments: Traps tight and tender across high anterior left chest where points to area of chest pain, but does not reproduce same chest discomfort   Lymphadenopathy:     Cervical: No cervical adenopathy.  Neurological:     Mental Status: She is alert.      Assessment & Plan   Palpitations:  no findings on ECG or rhythm strip or exam today.  Would like for her to check her pulse for a minute when she has the sensation of palpitations and document.  Call those results. No presyncope or syncope/fatigue associated and short lived  2.  Left chest pain:  ECG not worrisome for CAD  and history not typical for CAD.  To return for fasting labs and Troponin tomorrow.  Risk factors of hypercholesterolemia and mildly overweight with prediabetes.  Recheck for risk factors fasting as well.  No family history of CAD.   ASA 81 mg daily for now. Suspect this is chest wall pain.  As she may be also having GERD symptoms, start Famotidine  40 mg daily and see if helps.  3.  Prediabetes:  A1C with labs tomorrow.  4.  Hyperlipidemia:  FLP tomorrow.

## 2024-06-21 ENCOUNTER — Other Ambulatory Visit

## 2024-06-21 DIAGNOSIS — E782 Mixed hyperlipidemia: Secondary | ICD-10-CM | POA: Diagnosis not present

## 2024-06-21 DIAGNOSIS — R7303 Prediabetes: Secondary | ICD-10-CM

## 2024-06-21 DIAGNOSIS — R002 Palpitations: Secondary | ICD-10-CM

## 2024-06-24 LAB — CBC WITH DIFFERENTIAL/PLATELET
Basophils Absolute: 0.1 x10E3/uL (ref 0.0–0.2)
Basos: 2 %
EOS (ABSOLUTE): 0.1 x10E3/uL (ref 0.0–0.4)
Eos: 2 %
Hematocrit: 40 % (ref 34.0–46.6)
Hemoglobin: 12.8 g/dL (ref 11.1–15.9)
Immature Grans (Abs): 0 x10E3/uL (ref 0.0–0.1)
Immature Granulocytes: 0 %
Lymphocytes Absolute: 1.9 x10E3/uL (ref 0.7–3.1)
Lymphs: 41 %
MCH: 28.8 pg (ref 26.6–33.0)
MCHC: 32 g/dL (ref 31.5–35.7)
MCV: 90 fL (ref 79–97)
Monocytes Absolute: 0.4 x10E3/uL (ref 0.1–0.9)
Monocytes: 8 %
Neutrophils Absolute: 2.3 x10E3/uL (ref 1.4–7.0)
Neutrophils: 47 %
Platelets: 259 x10E3/uL (ref 150–450)
RBC: 4.45 x10E6/uL (ref 3.77–5.28)
RDW: 12.7 % (ref 11.7–15.4)
WBC: 4.8 x10E3/uL (ref 3.4–10.8)

## 2024-06-24 LAB — LIPID PANEL W/O CHOL/HDL RATIO
Cholesterol, Total: 249 mg/dL — ABNORMAL HIGH (ref 100–199)
HDL: 57 mg/dL (ref >39)
LDL Chol Calc (NIH): 169 mg/dL — ABNORMAL HIGH (ref 0–99)
Triglycerides: 131 mg/dL (ref 0–149)
VLDL Cholesterol Cal: 23 mg/dL (ref 5–40)

## 2024-06-24 LAB — COMPREHENSIVE METABOLIC PANEL WITH GFR
ALT: 23 IU/L (ref 0–32)
AST: 23 IU/L (ref 0–40)
Albumin: 4.7 g/dL (ref 3.9–4.9)
Alkaline Phosphatase: 78 IU/L (ref 44–121)
BUN/Creatinine Ratio: 27 — ABNORMAL HIGH (ref 9–23)
BUN: 16 mg/dL (ref 6–24)
Bilirubin Total: 0.3 mg/dL (ref 0.0–1.2)
CO2: 24 mmol/L (ref 20–29)
Calcium: 9.8 mg/dL (ref 8.7–10.2)
Chloride: 102 mmol/L (ref 96–106)
Creatinine, Ser: 0.59 mg/dL (ref 0.57–1.00)
Globulin, Total: 2.8 g/dL (ref 1.5–4.5)
Glucose: 85 mg/dL (ref 70–99)
Potassium: 4.1 mmol/L (ref 3.5–5.2)
Sodium: 139 mmol/L (ref 134–144)
Total Protein: 7.5 g/dL (ref 6.0–8.5)
eGFR: 111 mL/min/1.73 (ref >59)

## 2024-06-24 LAB — TROPONIN T: Troponin T (Highly Sensitive): <6 ng/L (ref 0–14)

## 2024-06-24 LAB — HGB A1C W/O EAG: Hgb A1c MFr Bld: 5.7 % — ABNORMAL HIGH (ref 4.8–5.6)

## 2024-07-01 ENCOUNTER — Telehealth: Payer: Self-pay | Admitting: Internal Medicine

## 2024-07-01 NOTE — Telephone Encounter (Signed)
 Patient needs an appointment for lower abdominal pain, patient states pain started two weeks ago , pain was mainly on her right side and now patient states she feels pain on her lower abdomen , patient states she feels pain and describes feeling  bloated. Patient states she has discomfort when passing gas and urinating.   Patient states passing gas does not help discomfort continues .  Patient states she feels like if she is having discharge but states when she checks sometimes she sees a small white stain in her panty with  no odor. Patient states if she cleans she does not see discharge on paper .   We will call patient if there is a cancellation.

## 2024-07-02 NOTE — Telephone Encounter (Signed)
 Patient has been scheduled

## 2024-07-03 ENCOUNTER — Encounter: Payer: Self-pay | Admitting: Internal Medicine

## 2024-07-03 ENCOUNTER — Ambulatory Visit (INDEPENDENT_AMBULATORY_CARE_PROVIDER_SITE_OTHER): Admitting: Internal Medicine

## 2024-07-03 VITALS — BP 90/62 | HR 69 | Ht 63.0 in | Wt 152.0 lb

## 2024-07-03 DIAGNOSIS — R002 Palpitations: Secondary | ICD-10-CM

## 2024-07-03 DIAGNOSIS — K219 Gastro-esophageal reflux disease without esophagitis: Secondary | ICD-10-CM

## 2024-07-03 DIAGNOSIS — R1030 Lower abdominal pain, unspecified: Secondary | ICD-10-CM | POA: Diagnosis not present

## 2024-07-03 DIAGNOSIS — R8290 Unspecified abnormal findings in urine: Secondary | ICD-10-CM

## 2024-07-03 DIAGNOSIS — K5900 Constipation, unspecified: Secondary | ICD-10-CM

## 2024-07-03 DIAGNOSIS — E782 Mixed hyperlipidemia: Secondary | ICD-10-CM

## 2024-07-03 DIAGNOSIS — R079 Chest pain, unspecified: Secondary | ICD-10-CM

## 2024-07-03 DIAGNOSIS — R7303 Prediabetes: Secondary | ICD-10-CM

## 2024-07-03 LAB — POCT URINALYSIS DIPSTICK
Bilirubin, UA: NEGATIVE
Blood, UA: NEGATIVE
Glucose, UA: NEGATIVE
Ketones, UA: NEGATIVE
Nitrite, UA: NEGATIVE
Protein, UA: POSITIVE — AB
Spec Grav, UA: 1.01 (ref 1.010–1.025)
Urobilinogen, UA: 0.2 U/dL — AB
pH, UA: 6 (ref 5.0–8.0)

## 2024-07-03 MED ORDER — METAMUCIL SMOOTH TEXTURE 58.6 % PO POWD
1.0000 | Freq: Every day | ORAL | Status: DC
Start: 2024-07-03 — End: 2024-09-11

## 2024-07-03 NOTE — Patient Instructions (Signed)
 Tome un vaso de agua antes de cada comida Tome un minimo de 6 a 8 vasos de agua diarios Coma tres veces al dia Coma una proteina y ignacia grasa saludable con comida.  (huevos, pescado, pollo, pavo, y limite carnes rojas Coma 5 porciones diarias de legumbres.  Mezcle los colores Coma 2 porciones diarias de frutas con cascara cuando sea comestible Use platos pequeos Suelte su tenedor o cuchara despues de cada mordida hata que se mastique y se trague Come en la mesa con amigos o familiares por lo menos una vez al dia Apague la televisin y aparatos electrnicos durante la comida  Su objetivo debe ser perder una libra por semana  Estudios recientes indican que las personas quienes consumen todos de sus calorias durante 12 horas se bajan de pesocon Mas eficiencia.  Por ejemplo, si Usted come su primera comida a las 7:00 a.m., su comida final del dia se debe completar antes de las 7:00 p.m.  No platenos!!!!!!!!!!!!!!!

## 2024-07-03 NOTE — Progress Notes (Signed)
 Subjective:    Patient ID: Olivia Atkinson, female   DOB: Nov 29, 1975, 48 y.o.   MRN: [id number]   HPI    Bilateral lower abdominal pain:  Describes bloating type pain.  States she was having this when seen on 06/20/2024, but had just started and was not as bad.   Started in right lower quadrant and then days later, began involving entire lower abdomen.   Discomfort was initially intermittent, but about a week or more, has been constant. No melena or hematochezia Stools are hard and she strains to pass.  States this is common for her. She drinks 3 bottles of 8-12 ounces daily Coffee in the morning May have a shake with oatmeal, pecans/nuts, soy milk, strawberries and bananas daily.   1-2 servings of veggies daily 2 servings of fruit daily--shake above or maybe an extra.   Does not note any improvement in bloating when passes flatus or has a BM. Increased discomfort with urinates, but no dysuria.   No urinary frequency.    States the start of Famotidine  prescribed 7/31 did not seem to affect the discomfort.  2.  Palpitations:  decreased, but states she has had 2 episodes that awakened her from sleep--regular and fast.  She checked her pulse, but did not do so with time, so not clear how fast her HB was.  Troponin was okay with follow up of last visit 7/31   3.  Left high chest pain:  has not had recurrence since last visit.  4.  Acid taste in mouth:  resolved since starting Famotidine .    5.  Prediabetes:  discussed A1C is down to 5.7%  6.  Hypercholesterolemia:  Discussed total and LDL are up.  She reminded me she had plantar fasciitis and could not walk.     Current Meds  Medication Sig   aspirin  EC 81 MG tablet Take 1 tablet (81 mg total) by mouth daily. Swallow whole.   Black Cohosh  (RA BLACK COHOSH ) 200 MG CAPS Use as directed for hot flashes night sweats and insomnia   famotidine  (PEPCID ) 40 MG tablet Take 1 tablet (40 mg total) by mouth daily.   No Known  Allergies   Review of Systems    Objective:   BP 90/62 (BP Location: Left Arm, Patient Position: Sitting, Cuff Size: Normal)   Pulse 69   Ht 5' 3 (1.6 m)   Wt 152 lb (68.9 kg)   LMP  (LMP Unknown)   BMI 26.93 kg/m   Physical Exam Cardiovascular:     Rate and Rhythm: Normal rate and regular rhythm.     Heart sounds: Normal heart sounds. No murmur heard.    No friction rub.  Pulmonary:     Effort: Pulmonary effort is normal.     Breath sounds: Normal breath sounds.  Abdominal:     General: Bowel sounds are normal.     Palpations: Abdomen is soft. There is no hepatomegaly or splenomegaly.     Tenderness: There is generalized abdominal tenderness (mild and diffuse). There is no right CVA tenderness, left CVA tenderness, guarding or rebound. Negative signs include Murphy's sign.     Hernia: No hernia is present.  Neurological:     Mental Status: She is alert.      Assessment & Plan  Bilateral lower quadrant pain/bloating with constipation and straining.  Recent normal CBC, CMP.  Check UA, though feel unlikely due to urinary system abnormality.   To stop the banana in her  shake daily and add more nuts/almonds, metamucil, half an avocado and more berries--red raspberries and blackberries in particular for increased fiber and fewer carb calories.   Increase water to six 8 to 12 ounce bottles of water daily.  Work on increasing daily physical activity--cycling or swimming if possible with foot issues. History of IBS Addendum:  UA with protein and Leuks--send for urine culture  2.  Palpitations:  demonstration of checking pulse.  As she seems to be awakening suddently with this, to ask her husband about snoring and apneic episodes.  She has a follow up for this in October  3.  Chest pain:  no further episodes since visit 2 weeks ago.  Labs including Troponins okay save for FLP  4.  Hypercholesterolemia:  needs to work on diet and physical activity as above.    5.  Prediabetes:  improved.  Change to diet as above may also help

## 2024-07-05 LAB — URINE CULTURE: Organism ID, Bacteria: NO GROWTH

## 2024-09-06 ENCOUNTER — Other Ambulatory Visit (INDEPENDENT_AMBULATORY_CARE_PROVIDER_SITE_OTHER): Payer: Self-pay

## 2024-09-06 DIAGNOSIS — R7303 Prediabetes: Secondary | ICD-10-CM

## 2024-09-06 DIAGNOSIS — E782 Mixed hyperlipidemia: Secondary | ICD-10-CM

## 2024-09-07 LAB — COMPREHENSIVE METABOLIC PANEL WITH GFR
ALT: 14 IU/L (ref 0–32)
AST: 18 IU/L (ref 0–40)
Albumin: 4.5 g/dL (ref 3.9–4.9)
Alkaline Phosphatase: 63 IU/L (ref 41–116)
BUN/Creatinine Ratio: 23 (ref 9–23)
BUN: 14 mg/dL (ref 6–24)
Bilirubin Total: 0.4 mg/dL (ref 0.0–1.2)
CO2: 25 mmol/L (ref 20–29)
Calcium: 9.3 mg/dL (ref 8.7–10.2)
Chloride: 102 mmol/L (ref 96–106)
Creatinine, Ser: 0.62 mg/dL (ref 0.57–1.00)
Globulin, Total: 2.7 g/dL (ref 1.5–4.5)
Glucose: 82 mg/dL (ref 70–99)
Potassium: 3.8 mmol/L (ref 3.5–5.2)
Sodium: 140 mmol/L (ref 134–144)
Total Protein: 7.2 g/dL (ref 6.0–8.5)
eGFR: 110 mL/min/1.73 (ref >59)

## 2024-09-07 LAB — LIPID PANEL
Chol/HDL Ratio: 4 ratio (ref 0.0–4.4)
Cholesterol, Total: 217 mg/dL — ABNORMAL HIGH (ref 100–199)
HDL: 54 mg/dL (ref >39)
LDL Chol Calc (NIH): 143 mg/dL — ABNORMAL HIGH (ref 0–99)
Triglycerides: 112 mg/dL (ref 0–149)
VLDL Cholesterol Cal: 20 mg/dL (ref 5–40)

## 2024-09-07 LAB — HEMOGLOBIN A1C
Est. average glucose Bld gHb Est-mCnc: 120 mg/dL
Hgb A1c MFr Bld: 5.8 % — ABNORMAL HIGH (ref 4.8–5.6)

## 2024-09-07 LAB — CBC WITH DIFFERENTIAL/PLATELET
Basophils Absolute: 0.1 x10E3/uL (ref 0.0–0.2)
Basos: 2 %
EOS (ABSOLUTE): 0.1 x10E3/uL (ref 0.0–0.4)
Eos: 2 %
Hematocrit: 37.3 % (ref 34.0–46.6)
Hemoglobin: 11.8 g/dL (ref 11.1–15.9)
Immature Grans (Abs): 0 x10E3/uL (ref 0.0–0.1)
Immature Granulocytes: 0 %
Lymphocytes Absolute: 2 x10E3/uL (ref 0.7–3.1)
Lymphs: 49 %
MCH: 28.5 pg (ref 26.6–33.0)
MCHC: 31.6 g/dL (ref 31.5–35.7)
MCV: 90 fL (ref 79–97)
Monocytes Absolute: 0.3 x10E3/uL (ref 0.1–0.9)
Monocytes: 8 %
Neutrophils Absolute: 1.6 x10E3/uL (ref 1.4–7.0)
Neutrophils: 39 %
Platelets: 244 x10E3/uL (ref 150–450)
RBC: 4.14 x10E6/uL (ref 3.77–5.28)
RDW: 12.6 % (ref 11.7–15.4)
WBC: 4.2 x10E3/uL (ref 3.4–10.8)

## 2024-09-09 ENCOUNTER — Ambulatory Visit: Payer: Self-pay | Admitting: Internal Medicine

## 2024-09-11 ENCOUNTER — Encounter: Payer: Self-pay | Admitting: Internal Medicine

## 2024-09-11 ENCOUNTER — Ambulatory Visit: Payer: Self-pay | Admitting: Internal Medicine

## 2024-09-11 VITALS — BP 100/60 | HR 64 | Resp 20 | Ht 61.0 in | Wt 140.5 lb

## 2024-09-11 DIAGNOSIS — Z0000 Encounter for general adult medical examination without abnormal findings: Secondary | ICD-10-CM

## 2024-09-11 DIAGNOSIS — I8393 Asymptomatic varicose veins of bilateral lower extremities: Secondary | ICD-10-CM | POA: Diagnosis not present

## 2024-09-11 DIAGNOSIS — E782 Mixed hyperlipidemia: Secondary | ICD-10-CM | POA: Diagnosis not present

## 2024-09-11 DIAGNOSIS — R7303 Prediabetes: Secondary | ICD-10-CM

## 2024-09-11 NOTE — Progress Notes (Signed)
 "   Subjective:    Patient ID: Olivia Atkinson, female   DOB: Apr 11, 1976, 48 y.o.   MRN: [id number]   HPI  CPE without pap  1.  Pap:  Normal last 08/2023    2.  Mammogram:  Last 11/2022 and normal.  No family history of breast cancer.   3.  Osteoprevention:  3 servings of milk/cheese/yogurt daily.  She is not getting much exercise.  However, she is up and around with her granddaughter regularly.    4.  Guaiac Cards/FIT:  Last negative 09/2023.    5.  Colonoscopy:  Never.  No family history of colon cancer.   6.  Immunizations:  Declines COVID vaccine.   Immunization History  Administered Date(s) Administered   Influenza, Mdck, Trivalent,PF 6+ MOS(egg free) 08/22/2023   Influenza, Seasonal, Injecte, Preservative Fre 08/16/2024   Influenza,inj,Quad PF,6+ Mos 09/23/2016, 09/02/2022   Influenza,inj,quad, With Preservative 09/12/2019   Influenza-Unspecified 09/11/2020, 08/23/2021   PFIZER(Purple Top)SARS-COV-2 Vaccination 05/22/2020, 06/15/2020   Td 09/12/2019   Tdap 08/08/2009     7.  Glucose/Cholesterol:  Prediabetes with A1C fairly stable at 5.8%.  Hypercholesterolemia improved with LDL now down to 143.   Lipid Panel     Component Value Date/Time   CHOL 217 (H) 09/06/2024 1004   TRIG 112 09/06/2024 1004   HDL 54 09/06/2024 1004   CHOLHDL 4.0 09/06/2024 1004   CHOLHDL 4.8 Ratio 06/18/2008 2106   VLDL 31 06/18/2008 2106   LDLCALC 143 (H) 09/06/2024 1004   LABVLDL 20 09/06/2024 1004     Current Meds  Medication Sig   aspirin  EC 81 MG tablet Take 1 tablet (81 mg total) by mouth daily. Swallow whole. (Patient taking differently: Take 1 tablet (81 mg total) by mouth daily. Swallow whole.)   cetirizine (ZYRTEC) 10 MG tablet Take 10 mg by mouth daily. (Patient taking differently: Take 10 mg by mouth daily.)   famotidine  (PEPCID ) 40 MG tablet Take 1 tablet (40 mg total) by mouth daily.   No Known Allergies  Past Medical History:  Diagnosis Date   Cervical polyp  05/2022   Gestational diabetes    Hypercholesteremia    IBS (irritable bowel syndrome)    Lateral epicondylitis of both elbows 2023   Prediabetes 2011   Past Surgical History:  Procedure Laterality Date   CESAREAN SECTION  1999   Family History  Problem Relation Age of Onset   Diabetes Mother    Hypertension Mother    Hyperlipidemia Mother    Arthritis Father    Hyperlipidemia Father    Hypertension Father    Cancer Maternal Grandfather        skin cancer   Diabetes Maternal Grandfather    Diabetes Paternal Grandmother    Cancer Paternal Grandmother        Throat   Heart disease Paternal Grandfather    Breast cancer Neg Hx    . Social History   Socioeconomic History   Marital status: Media Planner    Spouse name: Jose   Number of children: 3   Years of education: 6   Highest education level: 6th grade  Occupational History   Occupation: [employer] Service [employer].    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   Occupation: [employer]  Tobacco Use   Smoking status: Never    Passive exposure: Never   Smokeless tobacco: Never  Vaping Use   Vaping status: Never Used  Substance and Sexual Activity   Alcohol use: No  Drug use: No   Sexual activity: Yes    Birth control/protection: Post-menopausal  Other Topics Concern   Not on file  Social History Narrative   Lives at home with long term boyfriend as well as their 3 children   And a dog, Scooby   Social Drivers of Corporate Investment Banker Strain: Low Risk  (09/11/2024)   Overall Financial Resource Strain (CARDIA)    Difficulty of Paying Living Expenses: Not very hard  Food Insecurity: No Food Insecurity (09/11/2024)   Hunger Vital Sign    Worried About Running Out of Food in the Last Year: Never true    Ran Out of Food in the Last Year: Never true  Transportation Needs: No Transportation Needs (09/11/2024)   PRAPARE - Administrator, Civil Service (Medical): No    Lack of Transportation  (Non-Medical): No  Physical Activity: Not on file  Stress: Not on file  Social Connections: Moderately Integrated (07/08/2019)   Social Connection and Isolation Panel    Frequency of Communication with Friends and Family: Three times a week    Frequency of Social Gatherings with Friends and Family: Three times a week    Attends Religious Services: More than 4 times per year    Active Member of Clubs or Organizations: No    Attends Banker Meetings: Never    Marital Status: Married  Catering Manager Violence: Not At Risk (09/11/2024)   Humiliation, Afraid, Rape, and Kick questionnaire    Fear of Current or Ex-Partner: No    Emotionally Abused: No    Physically Abused: No    Sexually Abused: No      Review of Systems  HENT:  Positive for dental problem (needs a crown place).   Eyes:  Negative for visual disturbance (Reading glasses).  Respiratory:  Negative for shortness of breath.   Cardiovascular:  Positive for palpitations (Very rare.). Negative for chest pain and leg swelling.  Endocrine:       Hot flashes are better  Genitourinary:  Negative for vaginal bleeding.      Objective:   BP 100/60 (BP Location: Right Arm, Patient Position: Sitting, Cuff Size: Large)   Pulse 64   Resp 20   Ht 5' 1 (1.549 m)   Wt 140 lb 8 oz (63.7 kg)   LMP 07/22/2022   BMI 26.55 kg/m   Physical Exam HENT:     Head: Normocephalic and atraumatic.     Right Ear: Tympanic membrane, ear canal and external ear normal.     Left Ear: Tympanic membrane, ear canal and external ear normal.     Nose: Nose normal.     Mouth/Throat:     Mouth: Mucous membranes are moist.     Pharynx: Oropharynx is clear.  Eyes:     Extraocular Movements: Extraocular movements intact.     Conjunctiva/sclera: Conjunctivae normal.     Pupils: Pupils are equal, round, and reactive to light.     Comments: Discs sharp  Neck:     Thyroid: No thyroid mass or thyromegaly.  Cardiovascular:     Rate and  Rhythm: Normal rate and regular rhythm.     Heart sounds: S1 normal and S2 normal. No murmur heard.    No friction rub. No S3 or S4 sounds.     Comments: No carotid bruits.  Carotid, radial, femoral, DP and PT pulses normal and equal.  Bilateral lower extremity varicosities.  Pulmonary:     Effort: Pulmonary  effort is normal.     Breath sounds: Normal breath sounds and air entry.  Chest:  Breasts:    Right: No inverted nipple, mass or nipple discharge.     Left: No inverted nipple, mass or nipple discharge.  Abdominal:     General: Bowel sounds are normal.     Palpations: Abdomen is soft. There is no hepatomegaly, splenomegaly or mass.     Tenderness: There is no abdominal tenderness.     Hernia: No hernia is present.  Genitourinary:    Comments: Normal external female genitalia Rechecked for cervical polyp noted in 08/2022 [facility] exam.  No polyp noted No uterine or adnexal mass or tenderness Musculoskeletal:        General: Normal range of motion.     Cervical back: Normal range of motion and neck supple.     Right lower leg: No edema.     Left lower leg: No edema.  Lymphadenopathy:     Head:     Right side of head: No submental or submandibular adenopathy.     Left side of head: No submental or submandibular adenopathy.     Cervical: No cervical adenopathy.     Upper Body:     Right upper body: No supraclavicular or axillary adenopathy.     Left upper body: No supraclavicular or axillary adenopathy.  Skin:    General: Skin is warm.     Capillary Refill: Capillary refill takes less than 2 seconds.     Findings: No rash.  Neurological:     General: No focal deficit present.     Mental Status: She is alert and oriented to person, place, and time.     Cranial Nerves: Cranial nerves 2-12 are intact.     Sensory: Sensation is intact.     Motor: Motor function is intact.     Coordination: Coordination is intact.     Gait: Gait is intact.     Deep Tendon Reflexes:  Reflexes are normal and symmetric.  Psychiatric:        Mood and Affect: Mood normal.        Speech: Speech normal.        Behavior: Behavior normal. Behavior is cooperative.      Assessment & Plan   CPE without pap FIT to return in 2 weeks. Declined COVID vaccine. Elected to wait until next year for repeat mammogram.  2.  Prediabetes:  stable.  Encouraged continued lifestyle changes to normalize sugars  3.  Hypercholesterolemia:  Lifestyle changes.  Somewhat improved.  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  "

## 2024-09-23 ENCOUNTER — Encounter: Payer: Self-pay | Admitting: Radiology

## 2024-09-26 ENCOUNTER — Other Ambulatory Visit (INDEPENDENT_AMBULATORY_CARE_PROVIDER_SITE_OTHER)

## 2024-09-26 DIAGNOSIS — Z1211 Encounter for screening for malignant neoplasm of colon: Secondary | ICD-10-CM | POA: Diagnosis not present

## 2024-09-26 LAB — POC FIT TEST STOOL: Fecal Occult Blood: NEGATIVE

## 2024-09-27 ENCOUNTER — Telehealth: Payer: Self-pay | Admitting: Internal Medicine

## 2024-09-27 ENCOUNTER — Ambulatory Visit: Payer: Self-pay | Admitting: Internal Medicine

## 2024-11-03 ENCOUNTER — Encounter: Payer: Self-pay | Admitting: Emergency Medicine

## 2024-11-03 ENCOUNTER — Ambulatory Visit: Admission: EM | Admit: 2024-11-03 | Discharge: 2024-11-03 | Disposition: A | Discharge status: Home / Self Care

## 2024-11-03 DIAGNOSIS — J069 Acute upper respiratory infection, unspecified: Secondary | ICD-10-CM | POA: Diagnosis not present

## 2024-11-03 MED ORDER — AZELASTINE HCL 0.1 % NA SOLN: 1.0000 | Freq: Two times a day (BID) | NASAL | 12 refills | Status: AC

## 2024-11-03 MED ORDER — PSEUDOEPHEDRINE HCL 30 MG PO TABS: 30.0000 mg | ORAL_TABLET | ORAL | 0 refills | Status: AC | PRN

## 2024-11-03 NOTE — ED Triage Notes (Signed)
 Pt reports a headache, and burning in eyes and nose. States symptoms started on Tuesday. Has tried medication for a fever.

## 2024-11-03 NOTE — Discharge Instructions (Addendum)

## 2024-11-03 NOTE — ED Provider Notes (Signed)
 [facility]    CSN: [id number] Arrival date & time: 11/03/24  1120      History   Chief Complaint Chief Complaint  Patient presents with   Headache    HPI Olivia Atkinson is a 48 y.o. female.   Presents for headache, sinus pressure, burning in nose, and pain behind eyes since Tuesday.  Patient states that she has been taking Tylenol for symptoms with no relief.  Patient denies fever, change in appetite, or known sick contacts.  The history is provided by the patient.  Headache   Past Medical History:  Diagnosis Date   Cervical polyp 05/2022   Gestational diabetes    Hypercholesteremia    IBS (irritable bowel syndrome)    Lateral epicondylitis of both elbows 2023   Prediabetes 2011    Patient Active Problem List   Diagnosis Date Noted   Asymptomatic varicose veins of both lower extremities 09/11/2024   Plantar fasciitis of left foot 12/13/2023   Epigastric burning sensation 08/22/2023   Menopause 05/04/2023   Gastroesophageal reflux disease without esophagitis 05/04/2023   Muscle tension headache 05/04/2023   Palpitations 12/21/2022   Chronic nonintractable headache 12/21/2022   Cervical polyp 05/11/2022   Lateral epicondylitis of both elbows 01/27/2021   Bilateral medial epicondylitis of elbow joint 01/27/2021   Irritable bowel syndrome with constipation    Trochanteric bursitis of left hip 12/27/2019   Acute left-sided low back pain without sciatica 12/27/2019   Primary insomnia 09/23/2019   Perimenopausal vasomotor symptoms 09/12/2019   Tinea pedis of left foot 09/12/2019   Onychomycosis of toenail 09/12/2019   Sleep disorder 09/12/2019   Mixed hyperlipidemia 09/12/2019   Irritable bowel syndrome with both constipation and diarrhea 09/12/2019   Generalized anxiety disorder 07/30/2019   Prediabetes     Past Surgical History:  Procedure Laterality Date   CESAREAN SECTION  1999    OB History     Gravida  3   Para  3   Term   3   Preterm  0   AB  0   Living  3      SAB  0   IAB  0   Ectopic  0   Multiple      Live Births  3            Home Medications    Prior to Admission medications  Medication Sig Start Date End Date Taking? Authorizing Provider  azelastine  (ASTELIN ) 0.1 % nasal spray Place 1 spray into both nostrils 2 (two) times daily. Use in each nostril as directed 11/03/24  Yes Andra Krabbe C, PA-C  pseudoephedrine  (SUDAFED) 30 MG tablet Take 1 tablet (30 mg total) by mouth every 4 (four) hours as needed for congestion. 11/03/24  Yes Andra Krabbe BROCKS, PA-C  aspirin  EC 81 MG tablet Take 1 tablet (81 mg total) by mouth daily. Swallow whole. 06/20/24   Adella Norris, MD  cetirizine (ZYRTEC) 10 MG tablet Take 10 mg by mouth daily.    [provider]  famotidine  (PEPCID ) 40 MG tablet Take 1 tablet (40 mg total) by mouth daily. 06/20/24   Adella Norris, MD    Family History Family History  Problem Relation Age of Onset   Diabetes Mother    Hypertension Mother    Hyperlipidemia Mother    Arthritis Father    Hyperlipidemia Father    Hypertension Father    Cancer Maternal Grandfather        skin cancer  Diabetes Maternal Grandfather    Diabetes Paternal Grandmother    Cancer Paternal Grandmother        Throat   Heart disease Paternal Grandfather    Breast cancer Neg Hx     Social History Social History[1]   Allergies   Patient has no known allergies.   Review of Systems Review of Systems  Neurological:  Positive for headaches.     Physical Exam Triage Vital Signs ED Triage Vitals  Encounter Vitals Group     BP 11/03/24 1251 103/63     Girls Systolic BP Percentile --      Girls Diastolic BP Percentile --      Boys Systolic BP Percentile --      Boys Diastolic BP Percentile --      Pulse Rate 11/03/24 1251 69     Resp 11/03/24 1251 16     Temp 11/03/24 1251 98 F (36.7 C)     Temp Source 11/03/24 1251 Oral     SpO2 11/03/24  1251 98 %     Weight --      Height --      Head Circumference --      Peak Flow --      Pain Score 11/03/24 1250 6     Pain Loc --      Pain Education --      Exclude from Growth Chart --    No data found.  Updated Vital Signs BP 103/63 (BP Location: Left Arm)   Pulse 69   Temp 98 F (36.7 C) (Oral)   Resp 16   SpO2 98%   Visual Acuity Right Eye Distance:   Left Eye Distance:   Bilateral Distance:    Right Eye Near:   Left Eye Near:    Bilateral Near:     Physical Exam Vitals and nursing note reviewed.  Constitutional:      General: She is not in acute distress.    Appearance: Normal appearance. She is not ill-appearing, toxic-appearing or diaphoretic.  HENT:     Right Ear: Tympanic membrane, ear canal and external ear normal.     Left Ear: Tympanic membrane, ear canal and external ear normal.     Nose: Congestion (moderately enlarged turbinates) present. No rhinorrhea.     Right Sinus: Frontal sinus tenderness present. No maxillary sinus tenderness.     Left Sinus: Frontal sinus tenderness present. No maxillary sinus tenderness.     Comments: No tenderness to palpation of upper lip     Mouth/Throat:     Mouth: Mucous membranes are moist.     Pharynx: Oropharynx is clear. No oropharyngeal exudate or posterior oropharyngeal erythema.  Eyes:     General: No scleral icterus. Neurological:     Mental Status: She is alert.      UC Treatments / Results  Labs (all labs ordered are listed, but only abnormal results are displayed) Labs Reviewed - No data to display  EKG   Radiology No results found.  Procedures Procedures (including critical care time)  Medications Ordered in UC Medications - No data to display  Initial Impression / Assessment and Plan / UC Course  I have reviewed the triage vital signs and the nursing notes.  Pertinent labs & imaging results that were available during my care of the patient were reviewed by me and considered in my  medical decision making (see chart for details).    Final Clinical Impressions(s) / UC Diagnoses   Final  diagnoses:  Viral URI     Discharge Instructions      You been diagnosed with a viral illness today. -Viruses have to run their course and medicines that are prescribed are meant to help with symptoms. - With viruses usually feel poorly from 3 to 7 days with cough being the last symptoms to resolve.  -Cough can linger from days to weeks.  Antibiotics are not effective for viruses. -If your cough lasts more than 2 weeks and you are coughing so hard that you are vomiting or feel like you could pass out we need to follow-up with PCP for further testing and evaluation. -Rest, increase water intake, may use pseudoephedrine  for nasal congestion, Delsym (dextromethorphan) or honey as needed for cough, and ibuprofen  and/or Tylenol as directed on packaging for pain and fever. -If you have hypertension you should take Coricidin or other OTC meds approved for people with high blood pressure. -You may use a spoonful of honey every 4-6 hours as needed for throat pain and cough. -Warm tea with honey and lemon are helpful for soothe throat as well.  Chloraseptic and Cepacol make a throat lozenge with numbing medication, can be purchased over-the-counter. -May also use Flonase or sinus rinse for sinus pressure or nasal congestion.  Be sure to use distilled bottled water for sinus rinses. -May use coolmist humidifier to open up nasal passages -May elevate head to assist with postnasal drainage. -If you feel poorly (fever, fatigue, shortness of breath, nausea, etc.) for more than 10 days to be sure to follow-up with PCP or in clinic for further evaluation and additional treatments. If you experience chest pain with shortness of breath or pulse oxygen less than 95% you should report to the ER.    ED Prescriptions     Medication Sig Dispense Auth. Provider   azelastine  (ASTELIN ) 0.1 % nasal spray  Place 1 spray into both nostrils 2 (two) times daily. Use in each nostril as directed 30 mL Andra Krabbe C, PA-C   pseudoephedrine  (SUDAFED) 30 MG tablet Take 1 tablet (30 mg total) by mouth every 4 (four) hours as needed for congestion. 30 tablet Andra Krabbe BROCKS, PA-C      PDMP not reviewed this encounter.    [1]  Social History Tobacco Use   Smoking status: Never    Passive exposure: Never   Smokeless tobacco: Never  Vaping Use   Vaping status: Never Used  Substance Use Topics   Alcohol use: No   Drug use: No     Andra Krabbe BROCKS, PA-C 11/03/24 1335

## 2025-09-12 ENCOUNTER — Other Ambulatory Visit: Admitting: Internal Medicine

## 2025-09-17 ENCOUNTER — Encounter: Admitting: Internal Medicine
